# Patient Record
Sex: Male | Born: 1985 | Race: White | Hispanic: No | Marital: Single | Smoking: Light tobacco smoker
Health system: Southern US, Community
[De-identification: ages and names within clinical notes are randomized; demographics above are authoritative.]

## PROBLEM LIST (undated history)

## (undated) HISTORY — PX: SHOULDER ARTHROSCOPY W/ LABRAL REPAIR: SHX2399

---
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
## 2007-07-31 ENCOUNTER — Emergency Department (HOSPITAL_COMMUNITY): Admission: EM | Admit: 2007-07-31 | Discharge: 2007-08-01 | Payer: Self-pay | Admitting: Emergency Medicine

## 2013-11-20 ENCOUNTER — Ambulatory Visit (INDEPENDENT_AMBULATORY_CARE_PROVIDER_SITE_OTHER): Payer: BC Managed Care – PPO | Admitting: Family Medicine

## 2013-11-20 VITALS — BP 112/68 | HR 53 | Temp 97.2°F | Resp 16 | Ht 70.0 in | Wt 149.2 lb

## 2013-11-20 DIAGNOSIS — M79672 Pain in left foot: Secondary | ICD-10-CM

## 2013-11-20 DIAGNOSIS — IMO0002 Reserved for concepts with insufficient information to code with codable children: Secondary | ICD-10-CM

## 2013-11-20 DIAGNOSIS — M79609 Pain in unspecified limb: Secondary | ICD-10-CM

## 2013-11-20 MED ORDER — MELOXICAM 7.5 MG PO TABS: ORAL_TABLET | ORAL | Status: DC

## 2013-11-20 NOTE — Progress Notes (Signed)
[facility] [address], [place] [id number] [phone]  Date:  11/20/2013   Name:  David Hayes   DOB:  06/28/1985   MRN:  [id number]  PCP:  No PCP Per Patient    Chief Complaint: Foot Pain   History of Present Illness:  David Hayes is a 28 y.o. very pleasant male patient who presents with the following:  Here today with a problem with his left foot- he worked yesterday and then noted that the bottom of his feet were painful last night- left more than right.  He works at a [employer] and is on his feet many hours a day.  However he did not notice anything different yesterday and was not aware of injuring his feet He is generally healthy and otherwise he feels well  There are no active problems to display for this patient.   History reviewed. No pertinent past medical history.  History reviewed. No pertinent past surgical history.  History  Substance Use Topics  . Smoking status: Never Smoker   . Smokeless tobacco: Not on file  . Alcohol Use: Not on file    History reviewed. No pertinent family history.  Not on File  Medication list has been reviewed and updated.  No current outpatient prescriptions on file prior to visit.   No current facility-administered medications on file prior to visit.    Review of Systems:  As per HPI- otherwise negative.   Physical Examination: Filed Vitals:   11/20/13 0841  BP: 112/68  Pulse: 53  Temp: 97.2 F (36.2 C)  Resp: 16   Filed Vitals:   11/20/13 0841  Height: 5\' 10"  (1.778 m)  Weight: 149 lb 3.2 oz (67.677 kg)   Body mass index is 21.41 kg/(m^2). Ideal Body Weight: Weight in (lb) to have BMI = 25: 173.9  GEN: WDWN, NAD, Non-toxic, A & O x 3, slim build, looks well HEENT: Atraumatic, Normocephalic. Neck supple. No masses, No LAD. Ears and Nose: No external deformity. CV: RRR, No M/G/R. No JVD. No thrill. No extra heart sounds. PULM: CTA B, no wheezes, crackles, rhonchi. No  retractions. No resp. distress. No accessory muscle use. EXTR: No c/c/e NEURO walking on the sides of his feet PSYCH: Normally interactive. Conversant. Not depressed or anxious appearing.  Calm demeanor.  Feet: he appears to have bums on the plantar surface of both feet- right worse than left.  This is worst over the ball of the foot/ under the big toe.  There is mild edema of this area, normal capillary refill and perfusion of the feet.  His wound appears consistent also with excessive friction/ shear forces on the skin resulting in a friction burn- he will likely blister out under the affected skin  Assessment and Plan: Abrasion or friction burn of foot and toe(s), without mention of infection - Plan: meloxicam (MOBIC) 7.5 MG tablet  Friction burn to feet. He will need to rest and stay off his feet until this is better.  Cold compresses, mobic, note for work.  Follow-up if not better- Sooner if worse.     Signed Abbe AmsterdamJessica Yesica Kemler, MD

## 2013-11-20 NOTE — Patient Instructions (Signed)
You appear to have a type of burn on the bottom of your feet- likely due to friction from walking so much.  Stay off your feet as much as you can over the next couple of days!  You might try cool water soaks or ice packs on the bottom of you feet.  Use the mobic as needed for pain and inflammation.  I hope that you will be better in the next couple of days, but let me know if you are not!  Be sure to wear well- cushioned soft socks when you are working.

## 2014-10-02 ENCOUNTER — Encounter (HOSPITAL_BASED_OUTPATIENT_CLINIC_OR_DEPARTMENT_OTHER): Payer: Self-pay | Admitting: *Deleted

## 2014-10-02 ENCOUNTER — Emergency Department (HOSPITAL_BASED_OUTPATIENT_CLINIC_OR_DEPARTMENT_OTHER)
Admission: EM | Admit: 2014-10-02 | Discharge: 2014-10-02 | Disposition: A | Discharge status: Home / Self Care | Payer: BLUE CROSS/BLUE SHIELD | Attending: Emergency Medicine | Admitting: Emergency Medicine

## 2014-10-02 DIAGNOSIS — L02415 Cutaneous abscess of right lower limb: Secondary | ICD-10-CM | POA: Diagnosis present

## 2014-10-02 DIAGNOSIS — L03115 Cellulitis of right lower limb: Secondary | ICD-10-CM | POA: Diagnosis not present

## 2014-10-02 MED ORDER — SULFAMETHOXAZOLE-TRIMETHOPRIM 800-160 MG PO TABS: 1.0000 | ORAL_TABLET | Freq: Two times a day (BID) | ORAL | Status: AC

## 2014-10-02 MED ORDER — CEPHALEXIN 500 MG PO CAPS: 500.0000 mg | ORAL_CAPSULE | Freq: Three times a day (TID) | ORAL | Status: DC

## 2014-10-02 NOTE — ED Notes (Signed)
Possible abscess to his right knee. Redness and pain.

## 2014-10-02 NOTE — ED Provider Notes (Signed)
CSN: [id number]     Arrival date & time 10/02/14  2139 History  This chart was scribed for David MochaBlair Demari Kropp, MD by Phillis HaggisGabriella Gaje, ED Scribe. This patient was seen in room MH01/MH01 and patient care was started at 9:59 PM.     Chief Complaint  Patient presents with  . Abscess   Patient is a 29 y.o. male presenting with abscess. The history is provided by the patient. No language interpreter was used.  Abscess Location:  Leg Leg abscess location:  R knee Abscess quality: painful and redness   Red streaking: no   Duration:  2 days Progression:  Worsening Pain details:    Quality:  Aching   Severity:  Moderate Relieved by:  Nothing Worsened by:  Nothing tried Associated symptoms: no fever, no nausea and no vomiting     HPI Comments: David Hayes is a 29 y.o. male who presents to the Emergency Department complaining of a possible right knee abscess onset two day ago. He reports shooting pain and redness to the area. He states that it first started as a blister; drained and cleaned the area. States that he has been putting a heating pad on the area to no relief. Denies any new drainage. He denies fever, nausea, or vomiting. Denies allergies to anti-biotics.    History reviewed. No pertinent past medical history. History reviewed. No pertinent past surgical history. No family history on file. History  Substance Use Topics  . Smoking status: Never Smoker   . Smokeless tobacco: Not on file  . Alcohol Use: Yes    Review of Systems  Constitutional: Negative for fever.  Respiratory: Negative for cough and shortness of breath.   Gastrointestinal: Negative for nausea and vomiting.  All other systems reviewed and are negative.     Allergies  Review of patient's allergies indicates no known allergies.  Home Medications   Prior to Admission medications   Medication Sig Start Date End Date Taking? Authorizing Provider  meloxicam (MOBIC) 7.5 MG tablet Take 1 or 2 a day as needed for  pain and swelling in your feet 11/20/13   [name], MD   BP 111/60 mmHg  Pulse 72  Temp(Src) 98.1 F (36.7 C) (Oral)  Resp 20  Ht 6' (1.829 m)  Wt 160 lb (72.576 kg)  BMI 21.70 kg/m2  SpO2 99% Physical Exam  Constitutional: He is oriented to person, place, and time. He appears well-developed and well-nourished. No distress.  HENT:  Head: Normocephalic and atraumatic.  Mouth/Throat: No oropharyngeal exudate.  Eyes: EOM are normal. Pupils are equal, round, and reactive to light.  Neck: Normal range of motion. Neck supple.  Cardiovascular: Normal rate and regular rhythm.  Exam reveals no friction rub.   No murmur heard. Pulmonary/Chest: Effort normal and breath sounds normal. No respiratory distress. He has no wheezes. He has no rales.  Abdominal: He exhibits no distension. There is no tenderness. There is no rebound.  Musculoskeletal: Normal range of motion. He exhibits no edema.       Right knee: Normal.       Legs: Neurological: He is alert and oriented to person, place, and time.  Skin: He is not diaphoretic.  Nursing note and vitals reviewed.   ED Course  Procedures (including critical care time) DIAGNOSTIC STUDIES: Oxygen Saturation is 99% on RA, normal by my interpretation.    COORDINATION OF CARE: 10:00 PM-Discussed treatment plan which includes oral anti-biotics, follow up if symptoms worsen with pt at bedside and  pt agreed to plan.   Labs Review Labs Reviewed - No data to display  Imaging Review No results found.   EKG Interpretation None      MDM   Final diagnoses:  Cellulitis of right leg    29 year old male here with redness at the knee. Located just above the tibial tuberosity, does not extend into the knee joint. No fevers, nausea, vomiting. No pain with fibrillation. He stated started as a blister and he drained it at home. Here he has redness and cellulitis just above the tibial tuberosity. The knee joint is normal without effusion or  redness. No concern for septic arthritis. There is no fluctuance Will treat for simple cellulitis with Bactrim and Keflex. Given instructions on wound care and strict return precautions. I personally performed the services described in this documentation, which was scribed in my presence. The recorded information has been reviewed and is accurate.     David Mocha, MD 10/02/14 [phone]

## 2014-10-02 NOTE — Discharge Instructions (Signed)
Cellulitis °Cellulitis is an infection of the skin and the tissue beneath it. The infected area is usually red and tender. Cellulitis occurs most often in the arms and lower legs.  °CAUSES  °Cellulitis is caused by bacteria that enter the skin through cracks or cuts in the skin. The most common types of bacteria that cause cellulitis are staphylococci and streptococci. °SIGNS AND SYMPTOMS  °· Redness and warmth. °· Swelling. °· Tenderness or pain. °· Fever. °DIAGNOSIS  °Your health care provider can usually determine what is wrong based on a physical exam. Blood tests may also be done. °TREATMENT  °Treatment usually involves taking an antibiotic medicine. °HOME CARE INSTRUCTIONS  °· Take your antibiotic medicine as directed by your health care provider. Finish the antibiotic even if you start to feel better. °· Keep the infected arm or leg elevated to reduce swelling. °· Apply a warm cloth to the affected area up to 4 times per day to relieve pain. °· Take medicines only as directed by your health care provider. °· Keep all follow-up visits as directed by your health care provider. °SEEK MEDICAL CARE IF:  °· You notice red streaks coming from the infected area. °· Your red area gets larger or turns dark in color. °· Your bone or joint underneath the infected area becomes painful after the skin has healed. °· Your infection returns in the same area or another area. °· You notice a swollen bump in the infected area. °· You develop new symptoms. °· You have a fever. °SEEK IMMEDIATE MEDICAL CARE IF:  °· You feel very sleepy. °· You develop vomiting or diarrhea. °· You have a general ill feeling (malaise) with muscle aches and pains. °MAKE SURE YOU:  °· Understand these instructions. °· Will watch your condition. °· Will get help right away if you are not doing well or get worse. °Document Released: 12/29/2004 Document Revised: 08/05/2013 Document Reviewed: 06/06/2011 °ExitCare® Patient Information ©2015 [employer].  This information is not intended to replace advice given to you by your health care provider. Make sure you discuss any questions you have with your health care provider. ° ° °Emergency Department Resource Guide °1) Find a Doctor and Pay Out of Pocket °Although you won't have to find out who is covered by your insurance plan, it is a good idea to ask around and get recommendations. You will then need to call the office and see if the doctor you have chosen will accept you as a new patient and what types of options they offer for patients who are self-pay. Some doctors offer discounts or will set up payment plans for their patients who do not have insurance, but you will need to ask so you aren't surprised when you get to your appointment. ° °2) Contact Your Local Health Department °Not all health departments have doctors that can see patients for sick visits, but many do, so it is worth a call to see if yours does. If you don't know where your local health department is, you can check in your phone book. The CDC also has a tool to help you locate your state's health department, and many state websites also have listings of all of their local health departments. ° °3) Find a Walk-in Clinic °If your illness is not likely to be very severe or complicated, you may want to try a walk in clinic. These are popping up all over the country in pharmacies, drugstores, and shopping centers. They're usually staffed by nurse practitioners   or physician assistants that have been trained to treat common illnesses and complaints. They're usually fairly quick and inexpensive. However, if you have serious medical issues or chronic medical problems, these are probably not your best option. ° °No Primary Care Doctor: °- Call [facility] Connect at  832-8000 - they can help you locate a primary care doctor that  accepts your insurance, provides certain services, etc. °- Physician Referral Service- 1-800-533-3463 ° °Chronic Pain  Problems: °Organization         Address  Phone   Notes  °[facility]  (336) 297-2271 Patients need to be referred by their primary care doctor.  ° °Medication Assistance: °Organization         Address  Phone   Notes  °[place] [facility] [address] °[place], [place] [address] (336) 641-8030 --Must be a resident of [place] °-- Must have NO insurance coverage whatsoever (no Medicaid/ Medicare, etc.) °-- The pt. MUST have a primary care doctor that directs their care regularly and follows them in the community °  °[facility]  (866) 331-1348   °[employer]  (888) 892-1162   ° °Agencies that provide inexpensive medical care: °Organization         Address  Phone   Notes  °[facility]  (336) 832-8035   °[facility]    (336) 832-7272   °[facility] [address] °[place], [place] [address] (336) 832-4777   °[facility] [address], °[place] (336) 271-4999   °[facility]    (336) 373-0678   °[facility]    (336) 272-1050   °[facility] ° [address], [place] Phone:  (336) 832-4444, Fax:  (336) 832-4440 Hours of Operation:  9 am - 6 pm, M-F.  Also accepts Medicaid/Medicare and self-pay.  °[facility] ° [address], [place] Phone: (336) 832-3150, Fax: (336) 832-3151. Hours of Operation:  8:30 am - 5:30 pm, M-F.  Also accepts Medicaid and self-pay.  °[facility] [place] [address], [place] Phone: (336) 878-6027   °[facility] [address], [place], [place] (336)723-1848, Ext. 123 Mondays & Thursdays: 7-9 AM.  First 15 patients are seen on a first come, first serve basis. °  ° °Medicaid-accepting [place] Providers: ° °Organization         Address  Phone   Notes  °[facility] [address], [place] (336) 641-2100 Also  accepts self-pay patients.  °[facility] [address], [place] ° (336) 856-9996   °[facility] [address] [place] [address], [place] (336) 288-8857   °[facility] [address] [place] [address], [place] (336) 299-7000   °Veita Bland [address], [place]  ° (336) 373-1557 Only accepts [place] Access Medicaid patients after they have their name applied to their card.  ° °Self-Pay (no insurance) in [place]: ° °Organization         Address  Phone   Notes  °Sickle Cell Patients, [facility] [address], [place] (336) 832-1970   °[facility] [address], [place] (336) 832-4400   °[facility] ° [address], [place] (336) 992-4800   °[facility]/Dr. Osei-Bonsu ° [address]   [place] [address], [place] or [address], [place] (336) 841-8500 Phone number for both [place] and [place] locations is the same.  °[facility] [address], [place] (336) 299-0000   °[facility] [address] [place] [address], [place] or [address] (336) 852-7530 °(336) 878-2260   °[facility] [address], [place] (336) 350-1642, phone; (336) 294-5005, fax Sees patients 1st and 3rd Saturday of every month.  Must not qualify for public or private insurance (i.e. Medicaid, Medicare, [place] Health Choice, Veterans' Benefits) • Household income should be no more than 200% of the poverty level •The clinic cannot treat you if you are pregnant or think you are pregnant • Sexually transmitted diseases are not treated at the clinic.  ° ° °Dental Care: °Organization         Address  Phone  Notes  °[facility] [address], [place] (336) 641-6152 Accepts children up to age 21 who are enrolled in Medicaid or [place] Health Choice; pregnant  women with a Medicaid card; and children who have applied for Medicaid or [place] Health Choice, but were declined, whose parents can pay a reduced fee at time of service.  °[facility] [place]  [address], [place] (336) 641-7733 Accepts children up to age 21 who are enrolled in Medicaid or [place] Health Choice; pregnant women with a Medicaid card; and children who have applied for Medicaid or [place] Health Choice, but were declined, whose parents can pay a reduced fee at time of service.  °[facility] ° [address], [place] (336) 641-4533 Patients are seen by appointment only. Walk-ins are not accepted. [facility] will see patients 18 years of age and older. °Monday - Tuesday (8am-5pm) °Most Wednesdays (8:30-5pm) °$30 per visit, cash only  °[facility] ° [address], [place] (336) 641-4533 Patients are seen by appointment only. Walk-ins are not accepted. [facility] will see patients 18 years of age and older. °One Wednesday Evening (Monthly: Volunteer Based).  $30 per visit, cash only  °[facility]  (919) 537-3737 for adults; Children under age 4, call Graduate Pediatric Dentistry at (919) [phone]. Children aged 4-14, please call (919) [phone] to request a pediatric application. ° Dental services are provided in all areas of dental care including fillings, crowns and bridges, complete and partial dentures, implants, gum treatment, root canals, and extractions. Preventive care is also provided. Treatment is provided to both adults and children. °Patients are selected via a lottery and there is often a waiting list. °  °[facility] [address], ° ° (336) 763-8833 [email or web address] °  °[facility] [address], [place], [place] (336)723-1848, Ext. 123 Second and Fourth Thursday of each month, opens at 6:30 AM; Clinic ends at 9 AM.  Patients are  seen on a first-come first-served basis, and a limited number are seen during each clinic.  ° °[facility] ° [address], [place],  (336) 723-7904   Eligibility Requirements °You must have lived in [place], [place], or [place] counties for at least the last three months. °  You cannot be eligible for state or federal sponsored healthcare insurance, including Veterans Administration, Medicaid, or Medicare. °  You generally cannot be eligible for healthcare insurance through your employer.  °    How to apply: °Eligibility screenings are held every Tuesday and Wednesday afternoon from 1:00 pm until 4:00 pm. You do not need an appointment for the interview!  °[facility] [address], [place], [place] 336-631-2330   °[facility]  336-342-8273   °[facility]  336-703-3100   °[facility]  336-570-6415   ° °Behavioral Health Resources in the Community: °Intensive Outpatient Programs °Organization         Address  Phone  Notes  °[facility] [address], [place], [place] 336-878-6098   °[facility] [address], [place], [place] 336-832-9800   °[facility] [address], [place], [place] ° 336-882-2125   °[facility] [address],  °[place], [place] 1-800-853-5163 or 336-641-4981   °Substance Abuse Resources °Organization         Address  Phone  Notes  °[facility]  336-882-2125   °[facility]  336-784-9470   °[facility]  336-285-9073   °[facility]  336-845-3988   °Residential & Outpatient Substance Abuse Program  1-800-659-3381   °Psychological Services °Organization         Address  Phone  Notes  °[facility]  336- 832-9600   °[facility]  336- 378-7881   °[facility] [address], [place] 1-800-853-5163 or 336-641-4981   ° °Mobile Crisis  Teams °Organization         Address  Phone  Notes  °[facility], Mobile Crisis Care Unit  1-877-626-1772   °Assertive °[facility] ° [address] [place], [place] 336-834-9664   °Sharon DeEsch [address] °[place] 336-554-5454   ° °Self-Help/Support Groups °Organization         Address  Phone             Notes  °[facility] - variety of support groups  336- 373-1402 Call for more information  °Narcotics Anonymous (NA), [facility] [address], °[place]  2 meetings at this location  ° °Residential Treatment Programs °Organization         Address  Phone  Notes  °[facility] [address],    °[place]  1-866-801-8205   °[facility] ° [address], [place], [place] 704-293-8524   °[facility] [address], [place] 336-845-3988 Admissions: 8am-3pm M-F  °[facility] [address]    °[place], [place] 336-841-1104   °[facility] [address], [place], [place] 336-379-7146   °[facility] [address]  °[place], [place] 336-285-9073   °[facility] - Intensive Outpatient [address], [place], [place] 336-852-3033   °[facility] [address]  °[place], [place] 1-877-615-2722 or 336-784-9470   °[facility] [address] [place], [place] 336-227-7417 Accepts Medicaid  °[facility] [address]  °[place] 1-800-659-3381 Substance Abuse/Addiction Treatment  ° °[place] Behavioral Health Resources °Organization         Address  Phone  Notes  °[facility]  (888) 581-9988   °Julie Brannon, PhD [address] [place], [place]   (336) 349-5553 or (336) 951-0000   °[facility]   [address] °[place], [place] (336) 349-4454   °[facility] [address],   [place], [place] (336) 342-8316  Insurance/Medicaid/sponsorship through [facility]  °[facility] [address]                                    [place], [place] (336) 342-8316 Therapy/tele-psych/case  °[facility] [address]  ° [place], [place] (336) 349-2233    °Dr. Arfeen  (336) 349-4544   °[facility] [place]  [employer] [place] [facility] 1) [address], [place] °2) [address], [place] °3)  [address], [place] (336) 349-3220 °(336) 342-7768 ° °(336) 342-8140   °[place] Child Abuse Hotline (336) 342-1394 or (336) 342-3537 (After Hours)    ° ° ° °

## 2017-03-28 ENCOUNTER — Other Ambulatory Visit: Payer: Self-pay

## 2017-03-28 ENCOUNTER — Encounter (HOSPITAL_COMMUNITY): Payer: Self-pay | Admitting: Emergency Medicine

## 2017-03-28 ENCOUNTER — Emergency Department (HOSPITAL_COMMUNITY)
Admission: EM | Admit: 2017-03-28 | Discharge: 2017-03-28 | Disposition: A | Discharge status: Home / Self Care | Payer: Commercial Managed Care - PPO | Attending: Emergency Medicine | Admitting: Emergency Medicine

## 2017-03-28 DIAGNOSIS — Y9389 Activity, other specified: Secondary | ICD-10-CM | POA: Diagnosis not present

## 2017-03-28 DIAGNOSIS — Y999 Unspecified external cause status: Secondary | ICD-10-CM | POA: Insufficient documentation

## 2017-03-28 DIAGNOSIS — S61212A Laceration without foreign body of right middle finger without damage to nail, initial encounter: Secondary | ICD-10-CM | POA: Diagnosis not present

## 2017-03-28 DIAGNOSIS — W260XXA Contact with knife, initial encounter: Secondary | ICD-10-CM | POA: Diagnosis not present

## 2017-03-28 DIAGNOSIS — Y929 Unspecified place or not applicable: Secondary | ICD-10-CM | POA: Diagnosis not present

## 2017-03-28 DIAGNOSIS — Z23 Encounter for immunization: Secondary | ICD-10-CM | POA: Diagnosis not present

## 2017-03-28 MED ORDER — LIDOCAINE HCL (PF) 1 % IJ SOLN
5.0000 mL | Freq: Once | INTRAMUSCULAR | Status: AC
  Administered 2017-03-28: 5 mL
  Filled 2017-03-28: qty 5

## 2017-03-28 MED ORDER — TETANUS-DIPHTH-ACELL PERTUSSIS 5-2.5-18.5 LF-MCG/0.5 IM SUSP
0.5000 mL | Freq: Once | INTRAMUSCULAR | Status: AC
  Administered 2017-03-28: 0.5 mL via INTRAMUSCULAR
  Filled 2017-03-28: qty 0.5

## 2017-03-28 NOTE — ED Provider Notes (Signed)
[facility] Provider Note   CSN: [id number] Arrival date & time: 03/28/17  1714     History   Chief Complaint Chief Complaint  Patient presents with  . Laceration    HPI Providence CrosbyBrian Mato is a 31 y.o. male acute onset of laceration to right third finger.  Patient states he was trying to cut a hole in his belt with his pocket knife when his knife folded and cut his finger.  Denies decreased range of motion, or any other injuries.  Thinks his tetanus may be slightly out of date at 5 years.  No history of immunocompromise.  Not on anticoagulation. The history is provided by the patient.    History reviewed. No pertinent past medical history.  There are no active problems to display for this patient.   Past Surgical History:  Procedure Laterality Date  . SHOULDER ARTHROSCOPY W/ LABRAL REPAIR         Home Medications    Prior to Admission medications   Medication Sig Start Date End Date Taking? Authorizing Provider  cephALEXin (KEFLEX) 500 MG capsule Take 1 capsule (500 mg total) by mouth 3 (three) times daily. Patient not taking: Reported on 03/28/2017 10/02/14   Elwin MochaWalden, Blair, MD  meloxicam [facility](MOBIC) 7.5 MG tablet Take 1 or 2 a day as needed for pain and swelling in your feet Patient not taking: Reported on 03/28/2017 11/20/13   Copland, Gwenlyn FoundJessica C, MD    Family History History reviewed. No pertinent family history.  Social History Social History   Tobacco Use  . Smoking status: Never Smoker  . Smokeless tobacco: Never Used  Substance Use Topics  . Alcohol use: Yes  . Drug use: No     Allergies   Patient has no known allergies.   Review of Systems Review of Systems  Skin: Positive for wound.  All other systems reviewed and are negative.    Physical Exam Updated Vital Signs BP 115/77 (BP Location: Left Arm)   Pulse 65   Temp 97.7 F (36.5 C) (Oral)   Resp 15   Ht 5' 11.5" (1.816 m)   Wt 72.6 kg (160 lb)   SpO2  100%   BMI 22.00 kg/m   Physical Exam  Constitutional: He appears well-developed and well-nourished.  HENT:  Head: Normocephalic and atraumatic.  Eyes: Conjunctivae are normal.  Cardiovascular: Intact distal pulses.  Pulmonary/Chest: Effort normal.  Musculoskeletal:  1.5 cm laceration to palmar aspect extending to Adderall aspect of distal right third digit.  Grossly contaminated.  Full active flexion and extension of digit.  Intact distal pulses.  Normal sensation distal finger.  Psychiatric: He has a normal mood and affect. His behavior is normal.  Nursing note and vitals reviewed.    ED Treatments / Results  Labs (all labs ordered are listed, but only abnormal results are displayed) Labs Reviewed - No data to display  EKG  EKG Interpretation None       Radiology No results found.  Procedures .Marland Kitchen.Laceration Repair Date/Time: 03/28/2017 10:26 PM Performed by: Eveline Sauve, [place] N, PA-C Authorized by: Michelyn Scullin, [place] N, PA-C   Consent:    Consent obtained:  Verbal   Consent given by:  Patient   Risks discussed:  Infection, poor cosmetic result and pain   Alternatives discussed:  No treatment and observation Anesthesia (see MAR for exact dosages):    Anesthesia method:  Nerve block   Block needle gauge:  25 G   Block anesthetic:  Lidocaine 1%  w/o epi   Block injection procedure:  Anatomic landmarks palpated   Block outcome:  Anesthesia achieved Laceration details:    Location:  Finger   Finger location:  R long finger   Length (cm):  1.5 Repair type:    Repair type:  Simple Pre-procedure details:    Preparation:  Patient was prepped and draped in usual sterile fashion Exploration:    Hemostasis achieved with:  Direct pressure   Wound exploration: wound explored through full range of motion and entire depth of wound probed and visualized     Wound extent: no foreign bodies/material noted and no tendon damage noted   Treatment:    Area cleansed with:  Saline    Irrigation solution:  Sterile saline   Irrigation method:  Pressure wash Skin repair:    Repair method:  Sutures   Suture size:  5-0   Suture material:  Prolene   Number of sutures:  5 Approximation:    Approximation:  Close   Vermilion border: well-aligned   Post-procedure details:    Dressing:  Non-adherent dressing and splint for protection   Patient tolerance of procedure:  Tolerated well, no immediate complications   (including critical care time)  Medications Ordered in ED Medications  lidocaine (PF) (XYLOCAINE) 1 % injection 5 mL (5 mLs Infiltration Given 03/28/17 2129)  Tdap (BOOSTRIX) injection 0.5 mL (0.5 mLs Intramuscular Given 03/28/17 2128)     Initial Impression / Assessment and Plan / ED Course  I have reviewed the triage vital signs and the nursing notes.  Pertinent labs & imaging results that were available during my care of the patient were reviewed by me and considered in my medical decision making (see chart for details).     Pt with laceration to right 3rd digit. Normal active ROM of digit. Pressure irrigation performed. Wound explored and base of wound visualized in a bloodless field without evidence of foreign body or tendon damage.  Laceration occurred < 8 hours prior to repair which was well tolerated.  Tdap updated.  Pt has no comorbidities to effect normal wound healing. Pt discharged  without antibiotics.  Discussed suture home care with patient and answered questions. Pt to follow-up for wound check and suture removal in 5-7 days; they are to return to the ED sooner for signs of infection. Pt is hemodynamically stable with no complaints prior to dc.   Discussed results, findings, treatment and follow up. Patient advised of return precautions. Patient verbalized understanding and agreed with plan.  Final Clinical Impressions(s) / ED Diagnoses   Final diagnoses:  Laceration of right middle finger without foreign body without damage to nail, initial  encounter    ED Discharge Orders    None       Milen Lengacher, [place] N, PA-C 03/28/17 2230    Shaune PollackIsaacs, Cameron, MD 03/29/17 [phone]

## 2017-03-28 NOTE — ED Triage Notes (Signed)
Pt presents from home with an approximately 1 inch laceration to his right middle finger. Pt states he was using his pocket knife to cut a hole in his belt when the knife closed on his finger.

## 2017-03-28 NOTE — ED Notes (Signed)
See provider assessment 

## 2017-03-28 NOTE — Discharge Instructions (Signed)
Please read instructions below.  Keep your wound clean and covered.  24 hours, you can begin gently rinsing your finger with soap and water.  Gently pat it dry, and recover.  Wear the splint while at physical therapy. You can take Advil or Tylenol every 6 hours as needed for pain. Follow up with your primary care or urgent care for wound recheck in 5-7 days.  Return to the ER for fever, pus draining from wound, redness, or new or worsening symptoms.

## 2017-08-11 ENCOUNTER — Other Ambulatory Visit: Payer: Self-pay

## 2017-08-11 ENCOUNTER — Encounter (HOSPITAL_COMMUNITY): Payer: Self-pay | Admitting: *Deleted

## 2017-08-11 ENCOUNTER — Emergency Department (HOSPITAL_COMMUNITY)
Admission: EM | Admit: 2017-08-11 | Discharge: 2017-08-11 | Disposition: A | Discharge status: Home / Self Care | Payer: Commercial Managed Care - PPO | Attending: Emergency Medicine | Admitting: Emergency Medicine

## 2017-08-11 DIAGNOSIS — J020 Streptococcal pharyngitis: Secondary | ICD-10-CM

## 2017-08-11 DIAGNOSIS — J029 Acute pharyngitis, unspecified: Secondary | ICD-10-CM | POA: Diagnosis present

## 2017-08-11 LAB — GROUP A STREP BY PCR: GROUP A STREP BY PCR: DETECTED — AB

## 2017-08-11 NOTE — ED Provider Notes (Signed)
[facility] EMERGENCY DEPARTMENT Provider Note   CSN: [id number] Arrival date & time: 08/11/17  1037     History   Chief Complaint No chief complaint on file.   HPI David Hayes is a 32 y.o. male.  HPI  Patient is a 32 year old male with no sniffing past Casimiro Needle history presenting for sore throat.  Patient reports that it began 2 days ago, and he was having difficulty swallowing in the morning due to the pain.  Patient denies any obstructive swallowing, difficulty swallowing liquids, or difficulty breathing.  Patient denies any neck swelling, induration, or semitubular tenderness.  Patient reports that he spoke with a tele-physician through a insurance program through his work, and was told that he may have strep throat, and prescribed amoxicillin and ibuprofen.  Patient reports that he took 2 doses, but his sore throat was no better this morning.  Patient denies any fever or chills throughout this course.  Patient reports he is slightly congested, and he was coughing at the onset of illness, but is not a persistent cough.  History reviewed. No pertinent past medical history.  There are no active problems to display for this patient.   Past Surgical History:  Procedure Laterality Date  . SHOULDER ARTHROSCOPY W/ LABRAL REPAIR          Home Medications    Prior to Admission medications   Medication Sig Start Date End Date Taking? Authorizing Provider  cephALEXin (KEFLEX) 500 MG capsule Take 1 capsule (500 mg total) by mouth 3 (three) times daily. Patient not taking: Reported on 03/28/2017 10/02/14   Elwin Mocha, MD  meloxicam [facility]) 7.5 MG tablet Take 1 or 2 a day as needed for pain and swelling in your feet Patient not taking: Reported on 03/28/2017 11/20/13   Copland, Gwenlyn Found, MD    Family History No family history on file.  Social History Social History   Tobacco Use  . Smoking status: Never Smoker  . Smokeless tobacco: Never Used    Substance Use Topics  . Alcohol use: Yes  . Drug use: No     Allergies   Patient has no known allergies.   Review of Systems Review of Systems  Constitutional: Negative for chills and fever.  HENT: Positive for congestion, rhinorrhea and sore throat. Negative for trouble swallowing and voice change.   Respiratory: Negative for stridor.   Skin: Negative for rash.     Physical Exam Updated Vital Signs BP 119/73 (BP Location: Right Arm)   Pulse (!) 51   Temp 97.7 F (36.5 C) (Oral)   Resp 18   Ht 6' (1.829 m)   Wt 72.6 kg (160 lb)   SpO2 100%   BMI 21.70 kg/m   Physical Exam  Constitutional: He appears well-developed and well-nourished. No distress.  Sitting comfortably in bed.  HENT:  Head: Normocephalic and atraumatic.  Normal phonation. No muffled voice sounds. Patient swallows secretions without difficulty. Dentition normal. No lesions of tongue or buccal mucosa. Uvula midline. No asymmetric swelling of the posterior pharynx.erythema present of the posterior pharynx without exudate.  No lingual swelling. No induration inferior to tongue. No submandibular tenderness, swelling, or induration.  Tissues of the neck supple. + bilateral cervical lymphadenopathy.  Eyes: Conjunctivae are normal. Right eye exhibits no discharge. Left eye exhibits no discharge.  EOMs normal to gross examination.  Neck: Normal range of motion.  Cardiovascular: Normal rate, regular rhythm and normal heart sounds.  No murmur heard. Pulmonary/Chest: Effort normal and  breath sounds normal. He has no wheezes. He has no rales.  Normal respiratory effort. Patient converses comfortably. No audible wheeze or stridor.  Abdominal: He exhibits no distension.  Musculoskeletal: Normal range of motion.  Neurological: He is alert.  Cranial nerves intact to gross observation. Patient moves extremities without difficulty.  Skin: Skin is warm and dry. He is not diaphoretic.  Psychiatric: He has a normal mood  and affect. His behavior is normal. Judgment and thought content normal.  Nursing note and vitals reviewed.    ED Treatments / Results  Labs (all labs ordered are listed, but only abnormal results are displayed) Labs Reviewed  GROUP A STREP BY PCR - Abnormal; Notable for the following components:      Result Value   Group A Strep by PCR DETECTED (*)    All other components within normal limits    EKG None  Radiology No results found.  Procedures Procedures (including critical care time)  Medications Ordered in ED Medications - No data to display   Initial Impression / Assessment and Plan / ED Course  I have reviewed the triage vital signs and the nursing notes.  Pertinent labs & imaging results that were available during my care of the patient were reviewed by me and considered in my medical decision making (see chart for details).     Brandley Aldrete is a 32 y.o. male who presents to ED for sore throat sore throat. Patient nontoxic appearing and in no acute distress. Rapid strep positive, and patient is already on antibiotics.  We will have patient continue.  Presentation not concerning for PTA or infection spread to soft tissue. No trismus or uvula deviation. Patient with normal phonation. Exam demonstrates soft neck tissue, no swelling or induration inferior to the tongue or in the submandibular space  Patient able to drink water in ED without difficulty with intact air way. Specific return precautions discussed for change in voice, inability to tolerate secretions, difficulty breathing or swallowing, or increased nausea or vomiting. Discussed importance of hydration. Recommended PCP follow up. All questions answered.  Final Clinical Impressions(s) / ED Diagnoses   Final diagnoses:  Strep pharyngitis    ED Discharge Orders    None       Delia Chimes 08/11/17 1428    Raeford Razor, MD 08/14/17 [phone]

## 2017-08-11 NOTE — ED Notes (Signed)
Pt tolerating ice water at this time.

## 2017-08-11 NOTE — ED Notes (Signed)
Patient verbalizes understanding of discharge instructions. Opportunity for questioning and answers were provided. Armband removed by staff, pt discharged from ED ambulatory.   

## 2017-08-11 NOTE — ED Triage Notes (Signed)
Wed c/o sorethroat, spoke with [place] doc was given amoxicillin started taking on Thurs, called [facility] doc back on Thurs. Continue plan, states hes still not feeling better.

## 2017-08-11 NOTE — Discharge Instructions (Signed)
Please read and follow all provided instructions.  Your diagnoses today include:  1. Strep pharyngitis     Tests performed today include: Strep test: was POSITIVE for strep throat Vital signs. See below for your results today.   Medications prescribed:  Please continue the antibiotics that you were prescribed per the telemedicine physician.   Take any medications prescribed only as directed.   Home care instructions:  Please read the educational materials provided and follow any instructions contained in this packet.  Follow-up instructions: Please follow-up with your primary care provider as needed for further evaluation of your symptoms.  Return instructions:  Please return to the Emergency Department if you experience worsening symptoms.  Return if you have worsening problems swallowing, your neck becomes swollen, you cannot swallow your saliva or your voice becomes muffled.  Return with high persistent fever, persistent vomiting, or if you have trouble breathing.  Please return if you have any other emergent concerns.  Additional Information:  Your vital signs today were: BP 119/73 (BP Location: Right Arm)    Pulse (!) 51    Temp 97.7 F (36.5 C) (Oral)    Resp 18    Ht 6' (1.829 m)    Wt 72.6 kg (160 lb)    SpO2 100%    BMI 21.70 kg/m  If your blood pressure (BP) was elevated above 135/85 this visit, please have this repeated by your doctor within one month.

## 2017-08-12 ENCOUNTER — Telehealth: Payer: Self-pay

## 2017-08-12 NOTE — Telephone Encounter (Signed)
Post ED Visit - Positive Culture Follow-up  Culture report reviewed by antimicrobial stewardship pharmacist:   Enzo Bi, Pharm.D.  Celedonio Miyamoto, Pharm.D., BCPS AQ-ID  Garvin Fila, Pharm.D., BCPS  Georgina Pillion, Pharm.D., BCPS  [place], [place].D., BCPS, AAHIVP  Estella Husk, Pharm.D., BCPS, AAHIVP  Lysle Pearl, PharmD, BCPS  Sherlynn Carbon, PharmD  Pollyann Samples, PharmD, BCPS  Positive strep culture Treated with Amoxicillin, organism sensitive to the same and no further patient follow-up is required at this time.  Jerry Caras 08/12/2017, 10:22 AM

## 2017-10-18 ENCOUNTER — Encounter (HOSPITAL_COMMUNITY): Payer: Self-pay | Admitting: Emergency Medicine

## 2017-10-18 ENCOUNTER — Emergency Department (HOSPITAL_COMMUNITY): Payer: Commercial Managed Care - PPO

## 2017-10-18 ENCOUNTER — Emergency Department (HOSPITAL_COMMUNITY)
Admission: EM | Admit: 2017-10-18 | Discharge: 2017-10-18 | Disposition: A | Discharge status: Home / Self Care | Payer: Commercial Managed Care - PPO | Attending: Emergency Medicine | Admitting: Emergency Medicine

## 2017-10-18 ENCOUNTER — Other Ambulatory Visit: Payer: Self-pay

## 2017-10-18 DIAGNOSIS — M549 Dorsalgia, unspecified: Secondary | ICD-10-CM | POA: Insufficient documentation

## 2017-10-18 DIAGNOSIS — R0789 Other chest pain: Secondary | ICD-10-CM | POA: Insufficient documentation

## 2017-10-18 DIAGNOSIS — R0781 Pleurodynia: Secondary | ICD-10-CM

## 2017-10-18 MED ORDER — IBUPROFEN 200 MG PO TABS
600.0000 mg | ORAL_TABLET | Freq: Once | ORAL | Status: AC
  Administered 2017-10-18: 600 mg via ORAL
  Filled 2017-10-18: qty 3

## 2017-10-18 MED ORDER — ACETAMINOPHEN 500 MG PO TABS: 500.0000 mg | ORAL_TABLET | Freq: Four times a day (QID) | ORAL | 0 refills | Status: DC | PRN

## 2017-10-18 MED ORDER — IBUPROFEN 600 MG PO TABS: 600.0000 mg | ORAL_TABLET | Freq: Four times a day (QID) | ORAL | 0 refills | Status: DC | PRN

## 2017-10-18 NOTE — Discharge Instructions (Signed)
Take ibuprofen every 6 hours as prescribed.  You can also alternate with Tylenol.  Use ice and heat alternating 20 minutes on, 20 minutes off.  Please return to the emergency department if you develop any new or worsening symptoms.  You can establish care with a primary care provider by calling the number circled on your discharge paperwork.
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
## 2017-11-20 ENCOUNTER — Encounter (HOSPITAL_COMMUNITY): Payer: Self-pay | Admitting: Emergency Medicine

## 2017-11-20 ENCOUNTER — Emergency Department (HOSPITAL_COMMUNITY): Payer: Commercial Managed Care - PPO

## 2017-11-20 ENCOUNTER — Other Ambulatory Visit: Payer: Self-pay

## 2017-11-20 ENCOUNTER — Emergency Department (HOSPITAL_COMMUNITY)
Admission: EM | Admit: 2017-11-20 | Discharge: 2017-11-20 | Disposition: A | Discharge status: Home / Self Care | Payer: Commercial Managed Care - PPO | Attending: Emergency Medicine | Admitting: Emergency Medicine

## 2017-11-20 DIAGNOSIS — S50811A Abrasion of right forearm, initial encounter: Secondary | ICD-10-CM | POA: Insufficient documentation

## 2017-11-20 DIAGNOSIS — Y9289 Other specified places as the place of occurrence of the external cause: Secondary | ICD-10-CM | POA: Insufficient documentation

## 2017-11-20 DIAGNOSIS — Z720 Tobacco use: Secondary | ICD-10-CM | POA: Insufficient documentation

## 2017-11-20 DIAGNOSIS — Y9389 Activity, other specified: Secondary | ICD-10-CM | POA: Diagnosis not present

## 2017-11-20 DIAGNOSIS — Y999 Unspecified external cause status: Secondary | ICD-10-CM | POA: Insufficient documentation

## 2017-11-20 DIAGNOSIS — S79911A Unspecified injury of right hip, initial encounter: Secondary | ICD-10-CM | POA: Diagnosis not present

## 2017-11-20 DIAGNOSIS — S299XXA Unspecified injury of thorax, initial encounter: Secondary | ICD-10-CM | POA: Diagnosis present

## 2017-11-20 MED ORDER — METHOCARBAMOL 500 MG PO TABS: 500.0000 mg | ORAL_TABLET | Freq: Two times a day (BID) | ORAL | 0 refills | Status: AC
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
## 2017-11-20 NOTE — ED Notes (Signed)
Patient transported to X-ray 

## 2017-11-20 NOTE — Discharge Instructions (Addendum)
Prescribe muscle relaxers for your pain, please do not take these while drinking alcohol or driving as it can make you drowsy.  Apply heat/ice to the area.  If your symptoms worsen or you experience any chest pain, shortness of breath please return to the ED for reevaluation.

## 2017-11-23 ENCOUNTER — Emergency Department (HOSPITAL_COMMUNITY)
Admission: EM | Admit: 2017-11-23 | Discharge: 2017-11-23 | Disposition: A | Discharge status: Home / Self Care | Payer: Commercial Managed Care - PPO | Attending: Emergency Medicine | Admitting: Emergency Medicine

## 2017-11-23 ENCOUNTER — Encounter (HOSPITAL_COMMUNITY): Payer: Self-pay

## 2017-11-23 ENCOUNTER — Other Ambulatory Visit: Payer: Self-pay

## 2017-11-23 DIAGNOSIS — F17210 Nicotine dependence, cigarettes, uncomplicated: Secondary | ICD-10-CM | POA: Insufficient documentation

## 2017-11-23 DIAGNOSIS — K529 Noninfective gastroenteritis and colitis, unspecified: Secondary | ICD-10-CM | POA: Diagnosis not present

## 2017-11-23 DIAGNOSIS — R112 Nausea with vomiting, unspecified: Secondary | ICD-10-CM | POA: Diagnosis present

## 2017-11-23 DIAGNOSIS — Z79899 Other long term (current) drug therapy: Secondary | ICD-10-CM | POA: Diagnosis not present

## 2017-11-23 DIAGNOSIS — A084 Viral intestinal infection, unspecified: Secondary | ICD-10-CM

## 2017-11-23 LAB — COMPREHENSIVE METABOLIC PANEL
ALK PHOS: 72 U/L (ref 38–126)
ALT: 19 U/L (ref 0–44)
ANION GAP: 5 (ref 5–15)
AST: 26 U/L (ref 15–41)
Albumin: 3.8 g/dL (ref 3.5–5.0)
BILIRUBIN TOTAL: 0.6 mg/dL (ref 0.3–1.2)
BUN: 13 mg/dL (ref 6–20)
CALCIUM: 8.7 mg/dL — AB (ref 8.9–10.3)
CO2: 27 mmol/L (ref 22–32)
Chloride: 107 mmol/L (ref 98–111)
Creatinine, Ser: 1.03 mg/dL (ref 0.61–1.24)
GFR calc non Af Amer: >60 mL/min (ref >60)
Glucose, Bld: 118 mg/dL — ABNORMAL HIGH (ref 70–99)
Potassium: 3.6 mmol/L (ref 3.5–5.1)
Sodium: 139 mmol/L (ref 135–145)
TOTAL PROTEIN: 6.8 g/dL (ref 6.5–8.1)

## 2017-11-23 LAB — URINALYSIS, ROUTINE W REFLEX MICROSCOPIC
BILIRUBIN URINE: NEGATIVE
Glucose, UA: NEGATIVE mg/dL
Hgb urine dipstick: NEGATIVE
Ketones, ur: NEGATIVE mg/dL
Leukocytes, UA: NEGATIVE
NITRITE: NEGATIVE
PROTEIN: NEGATIVE mg/dL
SPECIFIC GRAVITY, URINE: 1.025 (ref 1.005–1.030)
pH: 6 (ref 5.0–8.0)

## 2017-11-23 LAB — CBC
HCT: 44.2 % (ref 39.0–52.0)
HEMOGLOBIN: 14.6 g/dL (ref 13.0–17.0)
MCH: 29.1 pg (ref 26.0–34.0)
MCHC: 33 g/dL (ref 30.0–36.0)
MCV: 88.2 fL (ref 78.0–100.0)
PLATELETS: 237 10*3/uL (ref 150–400)
RBC: 5.01 MIL/uL (ref 4.22–5.81)
RDW: 13.2 % (ref 11.5–15.5)
WBC: 6.3 10*3/uL (ref 4.0–10.5)

## 2017-11-23 LAB — LIPASE, BLOOD: Lipase: 37 U/L (ref 11–51)

## 2017-11-23 MED ORDER — ONDANSETRON 4 MG PO TBDP
4.0000 mg | ORAL_TABLET | Freq: Once | ORAL | Status: AC
Start: 2017-11-23 — End: 2017-11-23
  Administered 2017-11-23: 4 mg via ORAL
  Filled 2017-11-23: qty 1

## 2017-11-23 MED ORDER — ONDANSETRON HCL 4 MG PO TABS: 4.0000 mg | ORAL_TABLET | Freq: Four times a day (QID) | ORAL | 0 refills | Status: AC

## 2017-11-23 NOTE — ED Notes (Signed)
Pt reports waking up yesterday drenched in sweat, felt fatigued.  Woke up this am with n/v/d.  Vomited x 3 and also had 3 episodes of diarrhea.  He denies abd pain or any urinary sxs.  He is A&O x 4 and in NAD

## 2017-11-23 NOTE — ED Triage Notes (Signed)
Pt endorses n/v/d that began this morning after not feeling well yesterday. VSS. Feels better since having the n/v/d.

## 2017-11-23 NOTE — ED Provider Notes (Signed)
Patient placed in Quick Look pathway, seen and evaluated   Chief Complaint:  N/v/d  HPI:   Patient states he woke up yesterday feeling poorly.  Since then, he has had approximately 5 episodes of vomiting and multiple episodes of diarrhea.  He denies hematemesis or blood in the stool.  He denies current fevers or chills.  He denies current nausea.  He denies abdominal pain.  He denies sick contacts.  He is not on any antibiotics, takes no medications daily.  He has not taken anything for his symptoms.  No history of abdominal problems or abdominal surgeries.  ROS: n/v/d  Physical Exam:   Gen: No distress  Neuro: Awake and Alert  Skin: Warm  Abdomen: No tenderness palpation of the abdomen.  Soft without rigidity, guarding, distention.  Negative rebound.  No signs of peritonitis.  Negative Murphy's.   Initiation of care has begun. The patient has been counseled on the process, plan, and necessity for staying for the completion/evaluation, and the remainder of the medical screening examination    Alveria ApleyCaccavale, Tennyson Kallen, PA-C 11/23/17 1435    Long, Arlyss RepressJoshua G, MD 11/23/17 2022

## 2017-11-23 NOTE — Discharge Instructions (Addendum)
Please read attached information. If you experience any new or worsening signs or symptoms please return to the emergency room for evaluation. Please follow-up with your primary care provider or specialist as discussed. Please use medication prescribed only as directed and discontinue taking if you have any concerning signs or symptoms.   °
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
## 2018-01-01 ENCOUNTER — Encounter (HOSPITAL_COMMUNITY): Payer: Self-pay | Admitting: Emergency Medicine

## 2018-01-01 ENCOUNTER — Emergency Department (HOSPITAL_COMMUNITY)
Admission: EM | Admit: 2018-01-01 | Discharge: 2018-01-01 | Disposition: A | Discharge status: Home / Self Care | Payer: Commercial Managed Care - PPO | Attending: Emergency Medicine | Admitting: Emergency Medicine

## 2018-01-01 ENCOUNTER — Emergency Department (HOSPITAL_COMMUNITY): Payer: Commercial Managed Care - PPO

## 2018-01-01 DIAGNOSIS — R0981 Nasal congestion: Secondary | ICD-10-CM | POA: Diagnosis present

## 2018-01-01 DIAGNOSIS — J069 Acute upper respiratory infection, unspecified: Secondary | ICD-10-CM | POA: Insufficient documentation

## 2018-01-01 DIAGNOSIS — Z79899 Other long term (current) drug therapy: Secondary | ICD-10-CM | POA: Diagnosis not present

## 2018-01-01 DIAGNOSIS — F17200 Nicotine dependence, unspecified, uncomplicated: Secondary | ICD-10-CM | POA: Insufficient documentation

## 2018-01-01 MED ORDER — FLUTICASONE PROPIONATE 50 MCG/ACT NA SUSP: 2.0000 | Freq: Every day | NASAL | 0 refills | Status: AC

## 2018-01-01 MED ORDER — ALBUTEROL SULFATE HFA 108 (90 BASE) MCG/ACT IN AERS: 1.0000 | INHALATION_SPRAY | Freq: Four times a day (QID) | RESPIRATORY_TRACT | 0 refills | Status: AC | PRN

## 2018-01-01 MED ORDER — BENZONATATE 100 MG PO CAPS: 100.0000 mg | ORAL_CAPSULE | Freq: Three times a day (TID) | ORAL | 0 refills | Status: DC

## 2018-01-01 NOTE — Discharge Instructions (Signed)

## 2018-01-01 NOTE — ED Provider Notes (Signed)
[facility] EMERGENCY DEPARTMENT Provider Note   CSN: [id number] Arrival date & time: 01/01/18  0708     History   Chief Complaint Chief Complaint  Patient presents with  . Nasal Congestion  . Cough    HPI David Hayes is a 32 y.o. male.  HPI   Patient is a 32 year old male with no significant past medical history presents emergency department today for evaluation of URI symptoms.  Patient states he has had nasal congestion rhinorrhea, postnasal drip and sinus pressure/pain for the last 4 days.  Today he woke up with chest congestion and a dry cough.  Reports sweats last night but no documented fevers at home.  No sore throat or ear pain.  He had one episode of emesis this morning and has had no continued episodes of emesis.  No abdominal pain, diarrhea constipation or bloody stools.  No urinary sxs. Has not taken any medications for his pain.  Denies exacerbating or alleviating factors.  History reviewed. No pertinent past medical history.  There are no active problems to display for this patient.   Past Surgical History:  Procedure Laterality Date  . SHOULDER ARTHROSCOPY W/ LABRAL REPAIR          Home Medications    Prior to Admission medications   Medication Sig Start Date End Date Taking? Authorizing Provider  albuterol (PROVENTIL HFA;VENTOLIN HFA) 108 (90 Base) MCG/ACT inhaler Inhale 1-2 puffs into the lungs every 6 (six) hours as needed for wheezing or shortness of breath. 01/01/18   Forney Kleinpeter S, PA-C  benzonatate (TESSALON) 100 MG capsule Take 1 capsule (100 mg total) by mouth every 8 (eight) hours. 01/01/18   Amyri Frenz S, PA-C  fluticasone (FLONASE) 50 MCG/ACT nasal spray Place 2 sprays into both nostrils daily. 01/01/18   Ying Rocks S, PA-C  ibuprofen (ADVIL,MOTRIN) 200 MG tablet Take 400 mg by mouth every 6 (six) hours as needed for mild pain.    [provider]  ondansetron (ZOFRAN) 4 MG tablet Take 1 tablet (4 mg  total) by mouth every 6 (six) hours. 11/23/17   Eyvonne Mechanic, PA-C    Family History History reviewed. No pertinent family history.  Social History Social History   Tobacco Use  . Smoking status: Light Tobacco Smoker  . Smokeless tobacco: Never Used  Substance Use Topics  . Alcohol use: Yes    Comment: occasionally  . Drug use: No     Allergies   Patient has no known allergies.   Review of Systems Review of Systems  Constitutional: Positive for diaphoresis. Negative for chills and fever.  HENT: Positive for congestion, postnasal drip, rhinorrhea, sinus pressure and sinus pain.   Eyes: Negative for visual disturbance.  Respiratory: Positive for cough. Negative for shortness of breath.   Cardiovascular: Negative for chest pain.  Gastrointestinal: Positive for nausea and vomiting. Negative for abdominal pain, blood in stool, constipation and diarrhea.  Genitourinary: Negative for dysuria, frequency and urgency.  Musculoskeletal: Negative for back pain.  Skin: Negative for wound.  Neurological: Negative for headaches.    Physical Exam Updated Vital Signs BP 102/61 (BP Location: Right Arm)   Pulse 74   Temp 97.6 F (36.4 C) (Oral)   Resp 16   SpO2 99%   Physical Exam  Constitutional: He is oriented to person, place, and time. He appears well-developed and well-nourished. No distress.  HENT:  Right Ear: External ear normal.  Left Ear: External ear normal.  Mouth/Throat: Oropharynx is clear and  moist.  No pharyngeal erythema.  No tonsillar swelling or exudates.  Moist because membranes.  Bilateral nasal turbinates are swollen, right greater than left.  Eyes: Pupils are equal, round, and reactive to light. Conjunctivae and EOM are normal.  Neck: Neck supple.  Cardiovascular: Normal rate, regular rhythm, normal heart sounds and intact distal pulses.  Pulmonary/Chest: Effort normal and breath sounds normal. No stridor. No respiratory distress. He has no wheezes. He has  no rales.  Abdominal: Soft. Bowel sounds are normal. He exhibits no distension. There is no tenderness. There is no guarding.  Musculoskeletal: He exhibits no edema.  Lymphadenopathy:    He has no cervical adenopathy.  Neurological: He is alert and oriented to person, place, and time.  Skin: Skin is warm and dry.   ED Treatments / Results  Labs (all labs ordered are listed, but only abnormal results are displayed) Labs Reviewed - No data to display  EKG None  Radiology Dg Chest 2 View  Result Date: 01/01/2018 CLINICAL DATA:  Awakened this morning feeling hot and sweaty and congested. Discontinued smoking several months ago. EXAM: CHEST - 2 VIEW COMPARISON:  Chest x-rays of November 20, 2017 and October 18, 2017 included with rib detail images. FINDINGS: The lungs are mildly hyperinflated. There is hemidiaphragm flattening. There is no focal infiltrate. There is no pleural effusion. The heart and pulmonary vascularity are normal. The mediastinum is normal in width. The trachea is midline. The bony thorax exhibits no acute abnormality. IMPRESSION: Mild hyperinflation may be voluntary or may reflect some air trapping as might be seen with acute bronchitis or reactive airway disease. No acute pneumonia. Electronically Signed   By: David  [place] M.D.   On: 01/01/2018 09:01    Procedures Procedures (including critical care time)  Medications Ordered in ED Medications - No data to display   Initial Impression / Assessment and Plan / ED Course  I have reviewed the triage vital signs and the nursing notes.  Pertinent labs & imaging results that were available during my care of the patient were reviewed by me and considered in my medical decision making (see chart for details).     Final Clinical Impressions(s) / ED Diagnoses   Final diagnoses:  Upper respiratory tract infection, unspecified type   Pt CXR negative for acute infiltrate. Patients symptoms are consistent with URI, likely viral  etiology. Discussed that antibiotics are not indicated for viral infections. Pt will be discharged with symptomatic treatment.  Verbalizes understanding and is agreeable with plan. Pt is hemodynamically stable & in NAD prior to dc.  ED Discharge Orders         Ordered    fluticasone (FLONASE) 50 MCG/ACT nasal spray  Daily     01/01/18 0921    benzonatate (TESSALON) 100 MG capsule  Every 8 hours     01/01/18 0921    albuterol (PROVENTIL HFA;VENTOLIN HFA) 108 (90 Base) MCG/ACT inhaler  Every 6 hours PRN     01/01/18 0921           Karrie Meres, PA-C 01/01/18 0921    Melene Plan, DO 01/01/18 1610

## 2018-01-01 NOTE — ED Triage Notes (Signed)
Pt to ER for evaluation of waking up with nasal congestion, facial pain, dry cough, and chills. Pt in NAD.

## 2018-01-10 ENCOUNTER — Emergency Department (HOSPITAL_COMMUNITY)
Admission: EM | Admit: 2018-01-10 | Discharge: 2018-01-10 | Disposition: A | Discharge status: Home / Self Care | Payer: Commercial Managed Care - PPO | Attending: Emergency Medicine | Admitting: Emergency Medicine

## 2018-01-10 ENCOUNTER — Encounter (HOSPITAL_COMMUNITY): Payer: Self-pay | Admitting: *Deleted

## 2018-01-10 DIAGNOSIS — Z79899 Other long term (current) drug therapy: Secondary | ICD-10-CM | POA: Diagnosis not present

## 2018-01-10 DIAGNOSIS — J069 Acute upper respiratory infection, unspecified: Secondary | ICD-10-CM | POA: Diagnosis not present

## 2018-01-10 DIAGNOSIS — J029 Acute pharyngitis, unspecified: Secondary | ICD-10-CM | POA: Diagnosis present

## 2018-01-10 DIAGNOSIS — F17200 Nicotine dependence, unspecified, uncomplicated: Secondary | ICD-10-CM | POA: Insufficient documentation

## 2018-01-10 LAB — GROUP A STREP BY PCR: Group A Strep by PCR: NOT DETECTED

## 2018-01-10 NOTE — ED Provider Notes (Signed)
[facility] EMERGENCY DEPARTMENT Provider Note   CSN: [id number] Arrival date & time: 01/10/18  1704     History   Chief Complaint Chief Complaint  Patient presents with  . Sore Throat    HPI David Hayes is a 32 y.o. male.  David Hayes is a 32 y.o. Male who is otherwise healthy, woke up this morning with a sore throat.  He reports it feels irritated and raw and is painful to swallow but he has no difficulty eating and drinking.  He also reports for the past 3 days he has had some nasal drainage.  He reports with sore throat he also started having a dry cough this morning.  No fevers or chills.  No chest pain or shortness of breath, no abdominal pain, nausea or vomiting.  Patient has done nothing to treat symptoms prior to arrival.  Denies any other aggravating or alleviating factors.  No known sick contacts.     History reviewed. No pertinent past medical history.  There are no active problems to display for this patient.   Past Surgical History:  Procedure Laterality Date  . SHOULDER ARTHROSCOPY W/ LABRAL REPAIR          Home Medications    Prior to Admission medications   Medication Sig Start Date End Date Taking? Authorizing Provider  albuterol (PROVENTIL HFA;VENTOLIN HFA) 108 (90 Base) MCG/ACT inhaler Inhale 1-2 puffs into the lungs every 6 (six) hours as needed for wheezing or shortness of breath. 01/01/18   Couture, Cortni S, PA-C  benzonatate (TESSALON) 100 MG capsule Take 1 capsule (100 mg total) by mouth every 8 (eight) hours. 01/01/18   Couture, Cortni S, PA-C  fluticasone (FLONASE) 50 MCG/ACT nasal spray Place 2 sprays into both nostrils daily. 01/01/18   Couture, Cortni S, PA-C  ibuprofen (ADVIL,MOTRIN) 200 MG tablet Take 400 mg by mouth every 6 (six) hours as needed for mild pain or moderate pain.     [provider]  ondansetron (ZOFRAN) 4 MG tablet Take 1 tablet (4 mg total) by mouth every 6 (six) hours. Patient not taking:  Reported on 01/01/2018 11/23/17   Eyvonne Mechanic, PA-C    Family History No family history on file.  Social History Social History   Tobacco Use  . Smoking status: Light Tobacco Smoker  . Smokeless tobacco: Never Used  Substance Use Topics  . Alcohol use: Yes    Comment: occasionally  . Drug use: No     Allergies   Patient has no known allergies.   Review of Systems Review of Systems  Constitutional: Negative for chills and fever.  HENT: Positive for congestion, rhinorrhea and sore throat. Negative for ear pain.   Eyes: Negative for discharge, redness and itching.  Respiratory: Positive for cough. Negative for chest tightness, shortness of breath and wheezing.   Cardiovascular: Negative for chest pain.  Gastrointestinal: Negative for abdominal pain, nausea and vomiting.  Musculoskeletal: Negative for arthralgias and myalgias.  Skin: Negative for color change and rash.  Neurological: Negative for light-headedness and headaches.     Physical Exam Updated Vital Signs BP 102/61 (BP Location: Right Arm)   Pulse (!) 54   Temp (!) 97.3 F (36.3 C) (Oral)   Resp 17   SpO2 100%   Physical Exam  Constitutional: He appears well-developed and well-nourished.  Non-toxic appearance. He does not appear ill. No distress.  HENT:  Head: Normocephalic and atraumatic.  Mouth/Throat: Uvula is midline.  TMs clear with good landmarks,  moderate nasal mucosa edema with clear rhinorrhea, posterior oropharynx clear and moist, with some erythema, no edema or exudates, uvula midline  Eyes: Right eye exhibits no discharge. Left eye exhibits no discharge.  Neck: Neck supple.  Cardiovascular: Normal rate, regular rhythm, normal heart sounds and intact distal pulses. Exam reveals no gallop and no friction rub.  No murmur heard. Pulmonary/Chest: Effort normal and breath sounds normal. No respiratory distress.  Respirations equal and unlabored, patient able to speak in full sentences, lungs  clear to auscultation bilaterally  Abdominal: Soft. Bowel sounds are normal. He exhibits no distension and no mass. There is no tenderness. There is no guarding.  Abdomen soft, nondistended, nontender to palpation in all quadrants without guarding or peritoneal signs  Lymphadenopathy:    He has no cervical adenopathy.  Neurological: He is alert. Coordination normal.  Skin: Skin is warm and dry. Capillary refill takes less than 2 seconds. He is not diaphoretic.  Psychiatric: He has a normal mood and affect. His behavior is normal.  Nursing note and vitals reviewed.    ED Treatments / Results  Labs (all labs ordered are listed, but only abnormal results are displayed) Labs Reviewed  GROUP A STREP BY PCR    EKG None  Radiology No results found.  Procedures Procedures (including critical care time)  Medications Ordered in ED Medications - No data to display   Initial Impression / Assessment and Plan / ED Course  I have reviewed the triage vital signs and the nursing notes.  Pertinent labs & imaging results that were available during my care of the patient were reviewed by me and considered in my medical decision making (see chart for details).  Pt presents with nasal congestion, sore throat and cough. Pt is well appearing and vitals are normal. Lungs CTA on exam. Feel pneumonia is unlikely. Rapid strep negative. Patients symptoms are consistent with URI, likely viral etiology. Discussed that antibiotics are not indicated for viral infections. Pt will be discharged with symptomatic treatment.  Verbalizes understanding and is agreeable with plan. Pt is hemodynamically stable & in NAD prior to dc.   Final Clinical Impressions(s) / ED Diagnoses   Final diagnoses:  Viral upper respiratory tract infection    ED Discharge Orders    None       Dartha Lodge, Cordelia Poche 01/10/18 2029    Virgina Norfolk, DO 01/11/18 [phone]

## 2018-01-10 NOTE — Discharge Instructions (Addendum)
Your symptoms are likely caused by a viral upper respiratory infection. Antibiotics are not helpful in treating viral infection, the virus should run its course in about 5-7 days. Please make sure you are drinking plenty of fluids. You can treat your symptoms supportively with tylenol/ibuprofen for fevers and pains, Zyrtec and Flonase to help with nasal congestion, and over the counter cough syrups and throat lozenges to help with cough. Cepacol throat lozenges for sore throat. If your symptoms are not improving please follow up with you Primary doctor.   If you develop persistent fevers, shortness of breath or difficulty breathing, chest pain, severe headache and neck pain, persistent nausea and vomiting or other new or concerning symptoms return to the Emergency department.'

## 2018-01-10 NOTE — ED Triage Notes (Signed)
Woke this am with sore throat. Complains of sinus drainage for past 3 days. Throat is slightly red but without exudate noted. Appears in nad.

## 2018-01-31 ENCOUNTER — Emergency Department (HOSPITAL_COMMUNITY): Payer: Commercial Managed Care - PPO

## 2018-01-31 ENCOUNTER — Other Ambulatory Visit: Payer: Self-pay

## 2018-01-31 ENCOUNTER — Emergency Department (HOSPITAL_COMMUNITY)
Admission: EM | Admit: 2018-01-31 | Discharge: 2018-01-31 | Disposition: A | Discharge status: Home / Self Care | Payer: Commercial Managed Care - PPO | Attending: Emergency Medicine | Admitting: Emergency Medicine

## 2018-01-31 DIAGNOSIS — Z79899 Other long term (current) drug therapy: Secondary | ICD-10-CM | POA: Insufficient documentation

## 2018-01-31 DIAGNOSIS — J069 Acute upper respiratory infection, unspecified: Secondary | ICD-10-CM | POA: Insufficient documentation

## 2018-01-31 DIAGNOSIS — Z720 Tobacco use: Secondary | ICD-10-CM | POA: Insufficient documentation

## 2018-01-31 DIAGNOSIS — R0789 Other chest pain: Secondary | ICD-10-CM

## 2018-01-31 DIAGNOSIS — R079 Chest pain, unspecified: Secondary | ICD-10-CM | POA: Diagnosis present

## 2018-01-31 LAB — CBC
HEMATOCRIT: 46.8 % (ref 39.0–52.0)
HEMOGLOBIN: 15 g/dL (ref 13.0–17.0)
MCH: 28.3 pg (ref 26.0–34.0)
MCHC: 32.1 g/dL (ref 30.0–36.0)
MCV: 88.3 fL (ref 80.0–100.0)
NRBC: 0 % (ref 0.0–0.2)
PLATELETS: 259 10*3/uL (ref 150–400)
RBC: 5.3 MIL/uL (ref 4.22–5.81)
RDW: 12.9 % (ref 11.5–15.5)
WBC: 8.1 10*3/uL (ref 4.0–10.5)

## 2018-01-31 LAB — BASIC METABOLIC PANEL
ANION GAP: 7 (ref 5–15)
BUN: 11 mg/dL (ref 6–20)
CHLORIDE: 104 mmol/L (ref 98–111)
CO2: 26 mmol/L (ref 22–32)
Calcium: 9.4 mg/dL (ref 8.9–10.3)
Creatinine, Ser: 0.84 mg/dL (ref 0.61–1.24)
GFR calc non Af Amer: >60 mL/min (ref >60)
GLUCOSE: 89 mg/dL (ref 70–99)
POTASSIUM: 3.4 mmol/L — AB (ref 3.5–5.1)
Sodium: 137 mmol/L (ref 135–145)

## 2018-01-31 LAB — I-STAT TROPONIN, ED: Troponin i, poc: 0.01 ng/mL (ref 0.00–0.08)

## 2018-01-31 NOTE — ED Notes (Signed)
ED Provider at bedside. 

## 2018-01-31 NOTE — ED Notes (Signed)
Patient verbalizes understanding of discharge instructions. Opportunity for questioning and answers were provided. Armband removed by staff, pt discharged from ED.  

## 2018-01-31 NOTE — Discharge Instructions (Addendum)
Your symptoms today appear to be in the chest wall and associated with upper respiratory infection Return to the emergency department if you have worsening pain, shortness of breath, or fever

## 2018-01-31 NOTE — ED Triage Notes (Signed)
Pt stated, I was here 3 weeks ago for the same symptoms

## 2018-01-31 NOTE — ED Provider Notes (Signed)
[facility] EMERGENCY DEPARTMENT Provider Note   CSN: [id number] Arrival date & time: 01/31/18  1713     History   Chief Complaint Chief Complaint  Patient presents with  . Chest Pain  . Nasal Congestion  . Cough    HPI David Hayes is a 32 y.o. male.  HPI  32 year old previously healthy male presents today complaining of nasal congestion, cough, with some lower chest pain associated with the cough.  He noted the symptoms that began this morning.  The cough is been nonproductive.  He had some subjective fever earlier but has not had any fever later in the day.  He has taken no medications.  He denies any smoking or vaping.  He denies lateralized leg pain, DVT, or PE risk factors.  He describes the pain as sharp pain in the lower chest with coughing and intermittent in nature.  Not having any pain at this time.  Some sore throat.  No past medical history on file.  There are no active problems to display for this patient.   Past Surgical History:  Procedure Laterality Date  . SHOULDER ARTHROSCOPY W/ LABRAL REPAIR          Home Medications    Prior to Admission medications   Medication Sig Start Date End Date Taking? Authorizing Provider  albuterol (PROVENTIL HFA;VENTOLIN HFA) 108 (90 Base) MCG/ACT inhaler Inhale 1-2 puffs into the lungs every 6 (six) hours as needed for wheezing or shortness of breath. Patient not taking: Reported on 01/31/2018 01/01/18   Couture, Cortni S, PA-C  benzonatate (TESSALON) 100 MG capsule Take 1 capsule (100 mg total) by mouth every 8 (eight) hours. Patient not taking: Reported on 01/31/2018 01/01/18   Couture, Cortni S, PA-C  fluticasone (FLONASE) 50 MCG/ACT nasal spray Place 2 sprays into both nostrils daily. Patient not taking: Reported on 01/31/2018 01/01/18   Couture, Cortni S, PA-C  ondansetron (ZOFRAN) 4 MG tablet Take 1 tablet (4 mg total) by mouth every 6 (six) hours. Patient not taking: Reported on 01/01/2018  11/23/17   Eyvonne Mechanic, PA-C    Family History No family history on file.  Social History Social History   Tobacco Use  . Smoking status: Light Tobacco Smoker  . Smokeless tobacco: Never Used  Substance Use Topics  . Alcohol use: Yes    Comment: occasionally  . Drug use: No     Allergies   Patient has no known allergies.   Review of Systems Review of Systems  All other systems reviewed and are negative.    Physical Exam Updated Vital Signs BP 114/68   Pulse (!) 52   Temp 97.8 F (36.6 C) (Oral)   Resp 19   Ht 1.829 m (6')   Wt 68 kg   SpO2 100%   BMI 20.34 kg/m   Physical Exam  Constitutional: He is oriented to person, place, and time. He appears well-developed and well-nourished.  HENT:  Head: Normocephalic.  Some mild erythema in throat  Eyes: Pupils are equal, round, and reactive to light.  Neck: Normal range of motion.  Cardiovascular: Normal rate, regular rhythm, intact distal pulses and normal pulses.    No systolic murmur is present.  No diastolic murmur is present. Pulmonary/Chest: Effort normal and breath sounds normal.  Abdominal: Soft. Bowel sounds are normal.  Musculoskeletal: Normal range of motion.       Right lower leg: Normal.       Left lower leg: Normal.  Neurological: He is  alert and oriented to person, place, and time.  Skin: Skin is warm. Capillary refill takes less than 2 seconds.  Psychiatric: He has a normal mood and affect. His behavior is normal.  Nursing note and vitals reviewed.    ED Treatments / Results  Labs (all labs ordered are listed, but only abnormal results are displayed) Labs Reviewed  BASIC METABOLIC PANEL - Abnormal; Notable for the following components:      Result Value   Potassium 3.4 (*)    All other components within normal limits  CBC  I-STAT TROPONIN, ED    EKG EKG Interpretation  Date/Time:  Wednesday January 31 2018 18:03:46 EDT Ventricular Rate:  61 PR Interval:  122 QRS  Duration: 102 QT Interval:  397 QTC Calculation: 400 R Axis:   84 Text Interpretation:  Normal sinus rhythm Normal ECG Confirmed by Margarita Grizzle [phone]) on 01/31/2018 6:53:23 PM   Radiology Dg Chest 2 View  Result Date: 01/31/2018 CLINICAL DATA:  Chest pain EXAM: CHEST - 2 VIEW COMPARISON:  01/01/2018 FINDINGS: The heart size and mediastinal contours are within normal limits. Both lungs are clear. The visualized skeletal structures are unremarkable. IMPRESSION: No active cardiopulmonary disease. Electronically Signed   By: Deatra Robinson M.D.   On: 01/31/2018 18:10    Procedures Procedures (including critical care time)  Medications Ordered in ED Medications - No data to display   Initial Impression / Assessment and Plan / ED Course  I have reviewed the triage vital signs and the nursing notes.  Pertinent labs & imaging results that were available during my care of the patient were reviewed by me and considered in my medical decision making (see chart for details).     32 year old male with URI symptoms and associated chest pain with coughing.  EKG normal and have low index of suspicion for cardiac disease, lung lung disease, PE, or intra-abdominal abnormality.  Patient appears stable for discharge.  Final Clinical Impressions(s) / ED Diagnoses   Final diagnoses:  Viral upper respiratory tract infection  Chest wall pain    ED Discharge Orders    None       Margarita Grizzle, MD 01/31/18 1902

## 2018-01-31 NOTE — ED Provider Notes (Signed)
Patient placed in Quick Look pathway, seen and evaluated   Chief Complaint: pain in mid chest and cough  HPI:   Pt reports coughing and pain in mid chest afterwards  ROS: sinus drainage, no fever   Physical Exam:   Gen: No distress  Neuro: Awake and Alert  Skin: Warm    Focused Exam:  Lungs clear Heart rrr   Initiation of care has begun. The patient has been counseled on the process, plan, and necessity for staying for the completion/evaluation, and the remainder of the medical screening examination   Elson Areas, PA-C 01/31/18 1739    Virgina Norfolk, DO 02/01/18 1610

## 2018-01-31 NOTE — ED Triage Notes (Signed)
Pt. Stated, Ive had chest pressure this morning, and my nose has been stopped up for 2 days . Ive had a cough also.

## 2018-02-12 ENCOUNTER — Emergency Department (HOSPITAL_COMMUNITY): Payer: Commercial Managed Care - PPO

## 2018-02-12 ENCOUNTER — Emergency Department (HOSPITAL_COMMUNITY)
Admission: EM | Admit: 2018-02-12 | Discharge: 2018-02-12 | Disposition: A | Discharge status: Home / Self Care | Payer: Commercial Managed Care - PPO | Attending: Emergency Medicine | Admitting: Emergency Medicine

## 2018-02-12 ENCOUNTER — Other Ambulatory Visit: Payer: Self-pay

## 2018-02-12 ENCOUNTER — Encounter (HOSPITAL_COMMUNITY): Payer: Self-pay

## 2018-02-12 DIAGNOSIS — F17200 Nicotine dependence, unspecified, uncomplicated: Secondary | ICD-10-CM | POA: Insufficient documentation

## 2018-02-12 DIAGNOSIS — R070 Pain in throat: Secondary | ICD-10-CM | POA: Diagnosis present

## 2018-02-12 DIAGNOSIS — J069 Acute upper respiratory infection, unspecified: Secondary | ICD-10-CM | POA: Insufficient documentation

## 2018-02-12 DIAGNOSIS — R05 Cough: Secondary | ICD-10-CM | POA: Insufficient documentation

## 2018-02-12 LAB — GROUP A STREP BY PCR: Group A Strep by PCR: NOT DETECTED

## 2018-02-12 MED ORDER — LIDOCAINE VISCOUS HCL 2 % MT SOLN: 15.0000 mL | OROMUCOSAL | 0 refills | Status: AC | PRN

## 2018-02-12 NOTE — ED Notes (Signed)
Pt dc'd home with all belongings, a/o x4, ambulatory on dc, no narcotics given in ed 

## 2018-02-12 NOTE — ED Triage Notes (Signed)
Pt reports sore throat since yesterday. States he was working outside in the cold air Saturday and ever since he has felt bad.

## 2018-02-12 NOTE — Discharge Instructions (Addendum)
You have been diagnosed today with viral upper respiratory tract infection.  At this time there does not appear to be the presence of an emergent medical condition, however there is always the potential for conditions to change. Please read and follow the below instructions.  Please return to the Emergency Department immediately for any new or worsening symptoms or if your symptoms do not improve. Please be sure to follow up with your Primary Care Provider as soon as possible regarding your visit today; please call their office to schedule an appointment even if you are feeling better for a follow-up visit. You may use the lidocaine mouthwash as prescribed to help with your pain. Do not swallow this medication. Please drink plenty of water and get plenty of rest over the next few days to help with your symptoms.  Get help if: Your symptoms last for 10 days or longer. Your symptoms get worse over time. You have a fever. You have very bad pain in your face or forehead. Parts of your jaw or neck become very swollen. Get help right away if: You feel pain or pressure in your chest. You have shortness of breath. You faint or feel like you will faint. You keep throwing up (vomiting). You feel confused.  Please read the additional information packets attached to your discharge summary.  Do not take your medicine if  develop an itchy rash, swelling in your mouth or lips, or difficulty breathing.

## 2018-02-12 NOTE — ED Provider Notes (Signed)
Medical screening examination/treatment/procedure(s) were conducted as a shared visit with non-physician practitioner(s) and myself.  I personally evaluated the patient during the encounter.  Clinical Impression:   Final diagnoses:  Viral URI    The patient is a 32 year old male, otherwise healthy, started having a sore throat a couple of nights ago, has gradually worsened and is associated with a cough.  He has felt febrile but does not have a fever here.  He has been coughing with this.  On exam his lungs are clear, throat is erythematous but there is no exudate on the tonsils, there is no uvular deviation and his phonation is normal.  No lymphadenopathy of the neck, no torticollis or trismus.  Patient is well-appearing, stable for discharge, strep test pending, x-ray negative.  Patient agreeable to be discharged on an anti-inflammatory and possibly lidocaine.  Penicillin if strep positive   Eber Hong, MD 02/13/18 [phone]

## 2018-02-12 NOTE — ED Provider Notes (Signed)
[facility] EMERGENCY DEPARTMENT Provider Note   CSN: [id number] Arrival date & time: 02/12/18  1530     History   Chief Complaint Chief Complaint  Patient presents with  . Sore Throat    HPI David Hayes is a 32 y.o. male presented today for sore throat and cough that began yesterday morning upon awakening.  Patient states that he was at a football game on Saturday night and symptoms develop the next day.  Denies fever.  States that his sore throat is a moderate intensity burning sensation on both sides of his throat that is constant and worsened with swallowing.  Patient has not taken medications for his symptoms.  Patient denies drooling, voice change, difficulty swallowing food/water, nausea or vomiting.  Patient describes cough as a mild nonproductive cough that has been intermittent since yesterday.  Denies pain associated with his cough. Denies hemoptysis.  Denies leg swelling.  HPI  History reviewed. No pertinent past medical history.  There are no active problems to display for this patient.   Past Surgical History:  Procedure Laterality Date  . SHOULDER ARTHROSCOPY W/ LABRAL REPAIR          Home Medications    Prior to Admission medications   Medication Sig Start Date End Date Taking? Authorizing Provider  albuterol (PROVENTIL HFA;VENTOLIN HFA) 108 (90 Base) MCG/ACT inhaler Inhale 1-2 puffs into the lungs every 6 (six) hours as needed for wheezing or shortness of breath. Patient not taking: Reported on 01/31/2018 01/01/18   Couture, Cortni S, PA-C  benzonatate (TESSALON) 100 MG capsule Take 1 capsule (100 mg total) by mouth every 8 (eight) hours. Patient not taking: Reported on 01/31/2018 01/01/18   Couture, Cortni S, PA-C  fluticasone (FLONASE) 50 MCG/ACT nasal spray Place 2 sprays into both nostrils daily. Patient not taking: Reported on 01/31/2018 01/01/18   Couture, Cortni S, PA-C  lidocaine (XYLOCAINE) 2 % solution Use as directed 15 mLs  in the mouth or throat as needed for mouth pain. 02/12/18   Eber Hong, MD  ondansetron (ZOFRAN) 4 MG tablet Take 1 tablet (4 mg total) by mouth every 6 (six) hours. Patient not taking: Reported on 01/01/2018 11/23/17   Eyvonne Mechanic, PA-C    Family History No family history on file.  Social History Social History   Tobacco Use  . Smoking status: Light Tobacco Smoker  . Smokeless tobacco: Never Used  Substance Use Topics  . Alcohol use: Yes    Comment: occasionally  . Drug use: No     Allergies   Patient has no known allergies.   Review of Systems Review of Systems  Constitutional: Negative.  Negative for chills and fever.  HENT: Positive for congestion, rhinorrhea and sore throat. Negative for ear pain, facial swelling, trouble swallowing and voice change.   Respiratory: Positive for cough. Negative for shortness of breath and wheezing.   Cardiovascular: Negative.  Negative for chest pain and leg swelling.  Gastrointestinal: Negative.  Negative for nausea and vomiting.  Neurological: Negative.  Negative for headaches.    Physical Exam Updated Vital Signs BP 112/71 (BP Location: Right Arm)   Pulse 60   Temp 97.8 F (36.6 C) (Oral)   Resp 18   Ht 6' (1.829 m)   Wt 68 kg   SpO2 100%   BMI 20.34 kg/m   Physical Exam  Constitutional: He is oriented to person, place, and time. He appears well-developed and well-nourished. No distress.  HENT:  Head: Normocephalic and atraumatic.  Right Ear: Hearing, tympanic membrane, external ear and ear canal normal.  Left Ear: Hearing, tympanic membrane, external ear and ear canal normal.  Nose: Nose normal.  Mouth/Throat: Uvula is midline and mucous membranes are normal. No trismus in the jaw. No uvula swelling. No oropharyngeal exudate, posterior oropharyngeal edema or tonsillar abscesses. Tonsils are 1+ on the right. Tonsils are 1+ on the left. No tonsillar exudate.  The patient has normal phonation and is in control of  secretions. No stridor.  Midline uvula without edema. Soft palate rises symmetrically. Mild tonsillar erythema, no tonsillar swelling or exudates. Tongue protrusion is normal, floor of mouth is soft. No trismus. No creptius on neck palpation. No gingival erythema or fluctuance noted. Mucus membranes moist. No pallor noted.  Eyes: Pupils are equal, round, and reactive to light. EOM are normal.  Neck: Trachea normal, normal range of motion, full passive range of motion without pain and phonation normal. Neck supple. No neck rigidity. No tracheal deviation, no edema and no erythema present.  Cardiovascular: Normal rate, regular rhythm, normal heart sounds and intact distal pulses.  Pulmonary/Chest: Effort normal and breath sounds normal. No respiratory distress. He has no wheezes.  Abdominal: Soft. There is no tenderness. There is no rigidity, no rebound and no guarding.  Musculoskeletal: Normal range of motion.  Lymphadenopathy:    He has cervical adenopathy.  Neurological: He is alert and oriented to person, place, and time.  Speech is clear and goal oriented, follows commands Major Cranial nerves without deficit, no facial droop Sensation normal to light touch Moves extremities without ataxia, coordination intact Normal gait  Skin: Skin is warm and dry.  Psychiatric: He has a normal mood and affect. His behavior is normal.     ED Treatments / Results  Labs (all labs ordered are listed, but only abnormal results are displayed) Labs Reviewed  GROUP A STREP BY PCR    EKG None  Radiology Dg Chest 2 View  Result Date: 02/12/2018 CLINICAL DATA:  32 year old male with a history of cough and congestion EXAM: CHEST - 2 VIEW COMPARISON:  01/31/2018 FINDINGS: The heart size and mediastinal contours are within normal limits. Both lungs are clear. The visualized skeletal structures are unremarkable. IMPRESSION: Negative for acute cardiopulmonary disease Electronically Signed   By: Gilmer Mor  D.O.   On: 02/12/2018 16:18    Procedures Procedures (including critical care time)  Medications Ordered in ED Medications - No data to display   Initial Impression / Assessment and Plan / ED Course  I have reviewed the triage vital signs and the nursing notes.  Pertinent labs & imaging results that were available during my care of the patient were reviewed by me and considered in my medical decision making (see chart for details).    Patient with symptoms consistent with URI for 2 days.  Patient's CXR is negative for acute infiltrate.  Strep PCR negative.  Symptoms are likely of viral etiology. Discussed that antibiotics are not indicated for viral infections. Patient will be discharged with symptomatic treatment.  Viscous lidocaine mouthwash prescribed for comfort.  Patient verbalizes understanding and is agreeable with plan. Patient is hemodynamically stable and in no acute distress prior to discharge.  Afebrile, not tachycardic, not hypotensive well-appearing in no acute distress.  Patient is well-appearing, nontoxic, speaking well, handling secretions without difficulty and is able to swallow without difficulty.  No signs of peritonsillar abscess, Ludwig's angina, retropharyngeal abscess, preseptal/orbital cellulitis or any other deep tissue infections of the head/neck.  At this time there does not appear to be any evidence of an acute emergency medical condition and the patient appears stable for discharge with appropriate outpatient follow up. Diagnosis was discussed with patient who verbalizes understanding of care plan and is agreeable to discharge. I have discussed return precautions with patient who verbalizes understanding of return precautions. Patient strongly encouraged to follow-up with their PCP. All questions answered.  Patient was seen during this visit by Dr. Hyacinth Meeker who agrees with plan to discharge with PCP follow-up at this time.    Note: Portions of this report may  have been transcribed using voice recognition software. Every effort was made to ensure accuracy; however, inadvertent computerized transcription errors may still be present.  Final Clinical Impressions(s) / ED Diagnoses   Final diagnoses:  Viral URI    ED Discharge Orders         Ordered    lidocaine (XYLOCAINE) 2 % solution  As needed     02/12/18 1640           Bill Salinas, PA-C 02/12/18 1654    Eber Hong, MD 02/13/18 [phone]

## 2018-02-12 NOTE — ED Notes (Signed)
Patient transported to X-ray 

## 2018-02-15 ENCOUNTER — Other Ambulatory Visit: Payer: Self-pay

## 2018-02-15 ENCOUNTER — Emergency Department (HOSPITAL_COMMUNITY)
Admission: EM | Admit: 2018-02-15 | Discharge: 2018-02-15 | Disposition: A | Discharge status: Home / Self Care | Payer: Commercial Managed Care - PPO | Attending: Emergency Medicine | Admitting: Emergency Medicine

## 2018-02-15 DIAGNOSIS — F17210 Nicotine dependence, cigarettes, uncomplicated: Secondary | ICD-10-CM | POA: Diagnosis not present

## 2018-02-15 DIAGNOSIS — Z79899 Other long term (current) drug therapy: Secondary | ICD-10-CM | POA: Insufficient documentation

## 2018-02-15 DIAGNOSIS — R112 Nausea with vomiting, unspecified: Secondary | ICD-10-CM | POA: Diagnosis present

## 2018-02-15 DIAGNOSIS — J069 Acute upper respiratory infection, unspecified: Secondary | ICD-10-CM | POA: Diagnosis not present

## 2018-02-15 LAB — CBG MONITORING, ED: Glucose-Capillary: 113 mg/dL — ABNORMAL HIGH (ref 70–99)

## 2018-02-15 MED ORDER — ONDANSETRON 4 MG PO TBDP
4.0000 mg | ORAL_TABLET | Freq: Once | ORAL | Status: AC
  Administered 2018-02-15: 4 mg via ORAL
  Filled 2018-02-15: qty 1

## 2018-02-15 MED ORDER — ONDANSETRON 4 MG PO TBDP: 4.0000 mg | ORAL_TABLET | Freq: Three times a day (TID) | ORAL | 0 refills | Status: AC | PRN

## 2018-02-15 NOTE — ED Provider Notes (Signed)
[facility] EMERGENCY DEPARTMENT Provider Note   CSN: [id number] Arrival date & time: 02/15/18  9562     History   Chief Complaint Chief Complaint  Patient presents with  . Emesis  . Nausea    HPI David Hayes is a 32 y.o. male that sniffing and past medical history, presenting to the emergency department with subsequent visit for acute onset of nausea with nonbloody nonbilious emesis that began this morning.  Patient was recently seen on 02/12/2018 and diagnosed with a viral upper respiratory infection.  During that visit he had a negative chest x-ray and a negative strep by PCR.  He states his symptoms have been persistent, slightly improving.  Today on the way to work he began feeling nauseous and had one episode of emesis. Still has some mild nausea now.  He denies associated abdominal pain, diarrhea, constipation, fevers.  Denies difficulty breathing or swallowing.  Has been taking the viscous lidocaine as prescribed during last ED visit for sore throat, however no other over-the-counter medications.  The history is provided by the patient.    No past medical history on file.  There are no active problems to display for this patient.   Past Surgical History:  Procedure Laterality Date  . SHOULDER ARTHROSCOPY W/ LABRAL REPAIR          Home Medications    Prior to Admission medications   Medication Sig Start Date End Date Taking? Authorizing Provider  lidocaine (XYLOCAINE) 2 % solution Use as directed 15 mLs in the mouth or throat as needed for mouth pain. 02/12/18  Yes Eber Hong, MD  albuterol (PROVENTIL HFA;VENTOLIN HFA) 108 (90 Base) MCG/ACT inhaler Inhale 1-2 puffs into the lungs every 6 (six) hours as needed for wheezing or shortness of breath. Patient not taking: Reported on 01/31/2018 01/01/18   Couture, Cortni S, PA-C  benzonatate (TESSALON) 100 MG capsule Take 1 capsule (100 mg total) by mouth every 8 (eight) hours. Patient not taking:  Reported on 01/31/2018 01/01/18   Couture, Cortni S, PA-C  fluticasone (FLONASE) 50 MCG/ACT nasal spray Place 2 sprays into both nostrils daily. Patient not taking: Reported on 01/31/2018 01/01/18   Couture, Cortni S, PA-C  ondansetron (ZOFRAN ODT) 4 MG disintegrating tablet Take 1 tablet (4 mg total) by mouth every 8 (eight) hours as needed for nausea or vomiting. 02/15/18   Robinson, [place] N, PA-C  ondansetron (ZOFRAN) 4 MG tablet Take 1 tablet (4 mg total) by mouth every 6 (six) hours. Patient not taking: Reported on 01/01/2018 11/23/17   Eyvonne Mechanic, PA-C    Family History No family history on file.  Social History Social History   Tobacco Use  . Smoking status: Light Tobacco Smoker  . Smokeless tobacco: Never Used  Substance Use Topics  . Alcohol use: Yes    Comment: occasionally  . Drug use: No     Allergies   Patient has no known allergies.   Review of Systems Review of Systems  Constitutional: Negative for appetite change and fever.  HENT: Positive for congestion and sore throat. Negative for ear pain, trouble swallowing and voice change.   Respiratory: Negative for shortness of breath.   Gastrointestinal: Positive for nausea and vomiting. Negative for abdominal pain, constipation and diarrhea.  All other systems reviewed and are negative.    Physical Exam Updated Vital Signs BP 101/69   Pulse 60   Temp 97.8 F (36.6 C) (Oral)   Resp 18   Ht 6' (1.829 m)  Wt 68 kg   SpO2 100%   BMI 20.34 kg/m   Physical Exam  Constitutional: He appears well-developed and well-nourished. No distress.  Patient is not toxic appearing  HENT:  Head: Normocephalic and atraumatic.  Pharynx is mildly erythematous without swelling or exudates.  Uvula is midline without trismus.  Bilateral TMs are normal.  Nose sounds congested.  Eyes: Conjunctivae are normal.  Neck: Normal range of motion. Neck supple.  Mild anterior cervical adenopathy bilaterally.  Cardiovascular: Normal  rate, regular rhythm and normal heart sounds.  Pulmonary/Chest: Effort normal and breath sounds normal. No respiratory distress.  Abdominal: Soft. Bowel sounds are normal. He exhibits no distension and no mass. There is no tenderness. There is no rebound and no guarding.  Neurological: He is alert.  Skin: Skin is warm.  Psychiatric: He has a normal mood and affect. His behavior is normal.  Nursing note and vitals reviewed.    ED Treatments / Results  Labs (all labs ordered are listed, but only abnormal results are displayed) Labs Reviewed  CBG MONITORING, ED - Abnormal; Notable for the following components:      Result Value   Glucose-Capillary 113 (*)    All other components within normal limits    EKG None  Radiology No results found.  Procedures Procedures (including critical care time)  Medications Ordered in ED Medications  ondansetron (ZOFRAN-ODT) disintegrating tablet 4 mg (4 mg Oral Given 02/15/18 [id number])     Initial Impression / Assessment and Plan / ED Course  I have reviewed the triage vital signs and the nursing notes.  Pertinent labs & imaging results that were available during my care of the patient were reviewed by me and considered in my medical decision making (see chart for details).  Clinical Course as of Feb 15 737  Thu Feb 15, 2018  [id number] Patient reevaluated states nausea is improving and he is tolerating p.o.  Will discharge with symptomatic management and work note.   [JR]    Clinical Course User Index [JR] Robinson, [place] N, PA-C    Patient presenting to the emergency department with subsequent visit after recent viral URI diagnosis in the ED on 02/12/2018.  During that visit he had a negative chest x-ray negative strep by PCR.  Today he began having nausea with one episode of nonbloody nonbilious emesis.  No associated abdominal pain.  On exam, his vital signs are normal, afebrile.  Abdomen is soft and nontender.  Endorses some mild nausea  though not actively vomiting in the ED.  Lungs are clear.  ENT exam is reassuring.  CBG is 113.  Nausea treated.  I do believe this is still consistent with viral illness.  Do not believe blood work is indicated at this time with reassuring vital signs and exam findings.  Discussed with this with patient, and he is agreeable to plan for discharge with nausea medication and work note.  Discussed results, findings, treatment and follow up. Patient advised of return precautions. Patient verbalized understanding and agreed with plan.  Final Clinical Impressions(s) / ED Diagnoses   Final diagnoses:  Viral URI  Non-intractable vomiting with nausea, unspecified vomiting type    ED Discharge Orders         Ordered    ondansetron (ZOFRAN ODT) 4 MG disintegrating tablet  Every 8 hours PRN     02/15/18 0737           Robinson, [place] N, PA-C 02/15/18 [id number]    Azalia Bilisampos, Kevin, MD 02/15/18  0740  

## 2018-02-15 NOTE — ED Triage Notes (Signed)
Pt arrived POV d/t N/v that started this morning. Pt aeen on Monday d/t sore throat

## 2018-02-15 NOTE — ED Notes (Signed)
Pt CBG was 113, notified Jessica(RN) 

## 2018-02-15 NOTE — Discharge Instructions (Signed)
Please read instructions below. Stay hydrated. Drink clear liquids until your stomach feels better. Then, slowly introduce bland foods into your diet as tolerated, such as bread, rice, apples, bananas. Use saline nasal spray for congestion. You can take zofran every 8 hours as needed for nausea. Establish care with a primary care provider using the phone number in this discharge paperwork. Return to the ER for severe abdominal pain, fever, uncontrollable vomiting, inability to swallow liquids, difficulty breathing, or new or worsening symptoms.

## 2018-02-15 NOTE — ED Notes (Signed)
Pt states he unnderstands instructions and performs teach back. Pty to DC stable with steady gait.

## 2018-02-19 ENCOUNTER — Encounter (HOSPITAL_COMMUNITY): Payer: Self-pay | Admitting: Emergency Medicine

## 2018-02-19 ENCOUNTER — Emergency Department (HOSPITAL_COMMUNITY)
Admission: EM | Admit: 2018-02-19 | Discharge: 2018-02-19 | Disposition: A | Discharge status: Home / Self Care | Payer: Commercial Managed Care - PPO | Attending: Emergency Medicine | Admitting: Emergency Medicine

## 2018-02-19 ENCOUNTER — Other Ambulatory Visit: Payer: Self-pay

## 2018-02-19 DIAGNOSIS — M25561 Pain in right knee: Secondary | ICD-10-CM | POA: Insufficient documentation

## 2018-02-19 DIAGNOSIS — F17210 Nicotine dependence, cigarettes, uncomplicated: Secondary | ICD-10-CM | POA: Diagnosis not present

## 2018-02-19 MED ORDER — MELOXICAM 15 MG PO TABS: 15.0000 mg | ORAL_TABLET | Freq: Every day | ORAL | 0 refills | Status: AC

## 2018-02-19 NOTE — Discharge Instructions (Addendum)
Contact a health care provider if: °Your knee pain continues, changes, or gets worse. °You have a fever along with knee pain. °Your knee buckles or locks up. °Your knee swells, and the swelling becomes worse. °Get help right away if: °Your knee feels warm to the touch. °You cannot move your knee. °You have severe pain in your knee. °You have chest pain. °You have trouble breathing. °

## 2018-02-19 NOTE — ED Provider Notes (Signed)
[facility] Provider [facility]   CSN: [id number] Arrival date & time: 02/19/18  [id number]     History   Chief Complaint Chief Complaint  Patient presents with  . Knee Pain    HPI David Hayes is a 32 y.o. male presents the emergency Hayes chief complaint of knee pain.  Patient states that he was at the gym yesterday and was doing hip opening exercises.  When he abducted his hip rapidly and set his leg down he felt a snap or a buckling in the posterior and medial side of his leg.  He had immediate pain but was able to ambulate.  He states that he was doing well and does not have any significant pain with ambulation however this morning when he got out of bed and squatted deeply he had severe pain in that region.  He has been able to ambulate.  He denies any swelling he denies mechanical symptoms of catching, feelings of instability, popping.  He has no previous injuries to the knee.  HPI  History reviewed. No pertinent past medical history.  There are no active problems to display for this patient.   Past Surgical History:  Procedure Laterality Date  . SHOULDER ARTHROSCOPY W/ LABRAL REPAIR          Home Medications    Prior to Admission medications   Medication Sig Start Date End Date Taking? Authorizing Provider  albuterol (PROVENTIL HFA;VENTOLIN HFA) 108 (90 Base) MCG/ACT inhaler Inhale 1-2 puffs into the lungs every 6 (six) hours as needed for wheezing or shortness of breath. Patient not taking: Reported on 01/31/2018 01/01/18   Couture, Cortni S, PA-C  benzonatate (TESSALON) 100 MG capsule Take 1 capsule (100 mg total) by mouth every 8 (eight) hours. Patient not taking: Reported on 01/31/2018 01/01/18   Couture, Cortni S, PA-C  fluticasone (FLONASE) 50 MCG/ACT nasal spray Place 2 sprays into both nostrils daily. Patient not taking: Reported on 01/31/2018 01/01/18   Couture, Cortni S, PA-C  lidocaine (XYLOCAINE) 2 % solution Use as  directed 15 mLs in the mouth or throat as needed for mouth pain. 02/12/18   Eber HongMiller, Khyle, MD  meloxicam (MOBIC) 15 MG tablet Take 1 tablet (15 mg total) by mouth daily. With food. 02/19/18   Kajol Crispen, PA-C  ondansetron (ZOFRAN ODT) 4 MG disintegrating tablet Take 1 tablet (4 mg total) by mouth every 8 (eight) hours as needed for nausea or vomiting. 02/15/18   Robinson, [place] N, PA-C  ondansetron (ZOFRAN) 4 MG tablet Take 1 tablet (4 mg total) by mouth every 6 (six) hours. Patient not taking: Reported on 01/01/2018 11/23/17   Eyvonne MechanicHedges, Jeffrey, PA-C    Family History No family history on file.  Social History Social History   Tobacco Use  . Smoking status: Light Tobacco Smoker  . Smokeless tobacco: Never Used  Substance Use Topics  . Alcohol use: Yes    Comment: occasionally  . Drug use: No     Allergies   Patient has no known allergies.   Review of Systems Review of Systems  Musculoskeletal: Negative for gait problem and joint swelling.       Knee pain  Skin: Negative for color change.  Neurological: Negative for weakness and numbness.     Physical Exam Updated Vital Signs BP 126/79 (BP Location: Right Arm)   Pulse 79   Temp 97.8 F (36.6 C)   Resp 16   Wt 68 kg   SpO2 100%  BMI 20.34 kg/m   Physical Exam  Physical Exam  Nursing note and vitals reviewed. Constitutional: He appears well-developed and well-nourished. No distress.  HENT:  Head: Normocephalic and atraumatic.  Eyes: Conjunctivae normal are normal. No scleral icterus.  Neck: Normal range of motion. Neck supple.  Cardiovascular: Normal rate, regular rhythm and normal heart sounds.   Pulmonary/Chest: Effort normal and breath sounds normal. No respiratory distress.  Abdominal: Soft. There is no tenderness.  Musculoskeletal: He exhibits no edema. No effusion of the right knee.  No laxity of the MCL or LCL.  The patella does not have abnormal movement.  No white line tenderness, negative  McMurray's test, negative anterior posterior drawer.  Tenderness to palpation of the tendon tendons or the popliteal region.  Full active and passive range of motion, normal strength with flexion and extension of the knee Neurological: He is alert.  Skin: Skin is warm and dry. He is not diaphoretic.  Psychiatric: His behavior is normal.    ED Treatments / Results  Labs (all labs ordered are listed, but only abnormal results are displayed) Labs Reviewed - No data to display  EKG None  Radiology No results found.  Procedures Procedures (including critical care time)  Medications Ordered in ED Medications - No data to display   Initial Impression / Assessment and Plan / ED Course  I have reviewed the triage vital signs and the nursing notes.  Pertinent labs & imaging results that were available during my care of the patient were reviewed by me and considered in my medical decision making (see chart for details).     Patient with benign knee exam.  He has no tenderness, swelling, bruising.  His ligaments are stable.  I have no suspicion for internal derangement.  I suspect that the semi-tendinosis or semimembranosus tendons may have slipped and popped giving him the sensation he had.  Patient will be placed in a knee sleeve, urgent cryotherapy and anti-inflammatories and outpatient orthopedic or sports medicine follow-up.  Do not feel he needs any imaging at this time.  He is ambulatory and appears appropriate for discharge  Final Clinical Impressions(s) / ED Diagnoses   Final diagnoses:  Posterior knee pain, right    ED Discharge Orders         Ordered    meloxicam (MOBIC) 15 MG tablet  Daily     02/19/18 0706           Arthor Captain, PA-C 02/19/18 0754    Glynn Octave, MD 02/19/18 [phone]

## 2018-02-19 NOTE — ED Triage Notes (Signed)
Pt in with R knee pain, states he feels he overstretched it after working out 2 days ago. Able to bear weight on affected knee, but has very active job and pain is interfering.

## 2018-07-28 ENCOUNTER — Telehealth: Payer: Commercial Managed Care - PPO | Admitting: Nurse Practitioner

## 2018-07-28 DIAGNOSIS — J069 Acute upper respiratory infection, unspecified: Secondary | ICD-10-CM

## 2018-07-28 MED ORDER — BENZONATATE 100 MG PO CAPS: 100.0000 mg | ORAL_CAPSULE | Freq: Three times a day (TID) | ORAL | 0 refills | Status: AC | PRN

## 2018-07-28 NOTE — Progress Notes (Signed)
E-Visit for Corona Virus Screening  Based on your current symptoms, it seems unlikely that your symptoms are related to the Coronavirus.   Coronavirus disease 2019 (COVID-19) is a respiratory illness that can spread from person to person. The virus that causes COVID-19 is a new virus that was first identified in the country of [place] but is now found in multiple other countries and has spread to the [place].  Symptoms associated with the virus are mild to severe fever, cough, and shortness of breath. There is currently no vaccine to protect against COVID-19, and there is no specific antiviral treatment for the virus.   To be considered HIGH RISK for Coronavirus (COVID-19), you have to meet the following criteria:  . Traveled to [place], [place], [place], [place] or [place]; or in the [place] to [place], [place], [place], or [place]; and have fever, cough, and shortness of breath within the last 2 weeks of travel OR  . Been in close contact with a person diagnosed with COVID-19 within the last 2 weeks and have fever, cough, and shortness of breath  . IF YOU DO NOT MEET THESE CRITERIA, YOU ARE CONSIDERED LOW RISK FOR COVID-19.   It is vitally important that if you feel that you have an infection such as this virus or any other virus that you stay home and away from places where you may spread it to others.  You should self-quarantine for 14 days if you have symptoms that could potentially be coronavirus and avoid contact with people age 65 and older.   You can use medication such as A prescription cough medication called Tessalon Perles 100 mg. You may take 1-2 capsules every 8 hours as needed for cough  You may also take acetaminophen (Tylenol) as needed for fever.   Reduce your risk of any infection by using the same precautions used for avoiding the common cold or flu:  . Wash your hands often with soap and warm water for at least 20 seconds.  If soap and water are not readily  available, use an alcohol-based hand sanitizer with at least 60% alcohol.  . If coughing or sneezing, cover your mouth and nose by coughing or sneezing into the elbow areas of your shirt or coat, into a tissue or into your sleeve (not your hands). . Avoid shaking hands with others and consider head nods or verbal greetings only. . Avoid touching your eyes, nose, or mouth with unwashed hands.  . Avoid close contact with people who are sick. . Avoid places or events with large numbers of people in one location, like concerts or sporting events. . Carefully consider travel plans you have or are making. . If you are planning any travel outside or inside the [place], visit the CDC's Travelers' Health webpage for the latest health notices. . If you have some symptoms but not all symptoms, continue to monitor at home and seek medical attention if your symptoms worsen. . If you are having a medical emergency, call 911.  HOME CARE . Only take medications as instructed by your medical team. . Drink plenty of fluids and get plenty of rest. . A steam or ultrasonic humidifier can help if you have congestion.   GET HELP RIGHT AWAY IF: . You develop worsening fever. . You become short of breath . You cough up blood. . Your symptoms become more severe MAKE SURE YOU   Understand these instructions.  Will watch your condition.  Will get help   right away if you are not doing well or get worse.  Your e-visit answers were reviewed by a board certified advanced clinical practitioner to complete your personal care plan.  Depending on the condition, your plan could have included both over the counter or prescription medications.  If there is a problem please reply once you have received a response from your provider. Your safety is important to us.  If you have drug allergies check your prescription carefully.    You can use MyChart to ask questions about today's visit, request a non-urgent call back, or ask for a  work or school excuse for 24 hours related to this e-Visit. If it has been greater than 24 hours you will need to follow up with your provider, or enter a new e-Visit to address those concerns. You will get an e-mail in the next two days asking about your experience.  I hope that your e-visit has been valuable and will speed your recovery. Thank you for using e-visits.  5-10 minutes spent reviewing and documenting in chart.   

## 2018-07-30 ENCOUNTER — Telehealth: Payer: Commercial Managed Care - PPO | Admitting: Physician Assistant

## 2018-07-30 ENCOUNTER — Encounter: Payer: Self-pay | Admitting: Physician Assistant

## 2018-07-30 DIAGNOSIS — R059 Cough, unspecified: Secondary | ICD-10-CM

## 2018-07-30 DIAGNOSIS — R05 Cough: Secondary | ICD-10-CM

## 2018-07-30 DIAGNOSIS — R509 Fever, unspecified: Secondary | ICD-10-CM

## 2018-07-30 DIAGNOSIS — R0989 Other specified symptoms and signs involving the circulatory and respiratory systems: Secondary | ICD-10-CM

## 2018-07-30 DIAGNOSIS — R0602 Shortness of breath: Secondary | ICD-10-CM

## 2018-07-30 MED ORDER — ALBUTEROL SULFATE HFA 108 (90 BASE) MCG/ACT IN AERS: 2.0000 | INHALATION_SPRAY | RESPIRATORY_TRACT | 0 refills | Status: AC | PRN

## 2018-07-30 NOTE — Progress Notes (Signed)
E-Visit for Corona Virus Screening  Based on your current symptoms, you may very well have the virus, however your symptoms are mild. Currently, not all patients are being tested. If the symptoms are mild and there is not a known exposure, performing the test is not indicated.  Coronavirus disease 2019 (COVID-19) is a respiratory illness that can spread from person to person. The virus that causes COVID-19 is a new virus that was first identified in the country of [place] but is now found in multiple other countries and has spread to the [place].  Symptoms associated with the virus are mild to severe fever, cough, and shortness of breath. There is currently no vaccine to protect against COVID-19, and there is no specific antiviral treatment for the virus.   To be considered HIGH RISK for Coronavirus (COVID-19), you have to meet the following criteria:  . Traveled to [place], [place], [place], [place] or [place]; or in the [place] to [place], [place], [place], or [place]; and have fever, cough, and shortness of breath within the last 2 weeks of travel OR  . Been in close contact with a person diagnosed with COVID-19 within the last 2 weeks and have fever, cough, and shortness of breath  . IF YOU DO NOT MEET THESE CRITERIA, YOU ARE CONSIDERED LOW RISK FOR COVID-19.   It is vitally important that if you feel that you have an infection such as this virus or any other virus that you stay home and away from places where you may spread it to others.  You should self-quarantine for 14 days if you have symptoms that could potentially be coronavirus and avoid contact with people age 52 and older.   You can use medication such as A prescription inhaler called Albuterol MDI 90 mcg /actuation 2 puffs every 4 hours as needed for shortness of breath, wheezing, cough   Also, take the prescribed cough perles given on last evisit.    You may also take acetaminophen (Tylenol) as needed for  fever.  I have also prescribed a work note for the next 14 days.   As per our phone conversation, you stated that you also have "chest congestion" on the right side of the chest,  that you mainly feel first in the morning. You denied any diaphoresis, radiation of your symptoms, nausea, vomiting. If you develop any changes to this chest congestion or if you develop any chest pain, go to the ER.  Reduce your risk of any infection by using the same precautions used for avoiding the common cold or flu:  Marland Kitchen Wash your hands often with soap and warm water for at least 20 seconds.  If soap and water are not readily available, use an alcohol-based hand sanitizer with at least 60% alcohol.  . If coughing or sneezing, cover your mouth and nose by coughing or sneezing into the elbow areas of your shirt or coat, into a tissue or into your sleeve (not your hands). . Avoid shaking hands with others and consider head nods or verbal greetings only. . Avoid touching your eyes, nose, or mouth with unwashed hands.  . Avoid close contact with people who are sick. . Avoid places or events with large numbers of people in one location, like concerts or sporting events. . Carefully consider travel plans you have or are making. . If you are planning any travel outside or inside the [place], visit the CDC's Travelers' Health webpage for the latest health notices. Marland Kitchen  If you have some symptoms but not all symptoms, continue to monitor at home and seek medical attention if your symptoms worsen. . If you are having a medical emergency, call 911.  HOME CARE . Only take medications as instructed by your medical team. . Drink plenty of fluids and get plenty of rest. . A steam or ultrasonic humidifier can help if you have congestion.   GET HELP RIGHT AWAY IF: . You develop worsening fever. . You become short of breath . You cough up blood. . Your symptoms become more severe MAKE SURE YOU   Understand these instructions.  Will  watch your condition.  Will get help right away if you are not doing well or get worse.  Your e-visit answers were reviewed by a board certified advanced clinical practitioner to complete your personal care plan.  Depending on the condition, your plan could have included both over the counter or prescription medications.  If there is a problem please reply once you have received a response from your provider. Your safety is important to us.  If you have drug allergies check your prescription carefully.    You can use MyChart to ask questions about today's visit, request a non-urgent call back, or ask for a work or school excuse for 24 hours related to this e-Visit. If it has been greater than 24 hours you will need to follow up with your provider, or enter a new e-Visit to address those concerns. You will get an e-mail in the next two days asking about your experience.  I hope that your e-visit has been valuable and will speed your recovery. Thank you for using e-visits.   I have spent 7 min in completion and review of this note- Illa LevelSahar  [facility]

## 2018-12-20 ENCOUNTER — Encounter (HOSPITAL_COMMUNITY): Payer: Self-pay | Admitting: Emergency Medicine

## 2018-12-20 ENCOUNTER — Emergency Department (HOSPITAL_COMMUNITY)
Admission: EM | Admit: 2018-12-20 | Discharge: 2018-12-20 | Disposition: A | Discharge status: Home / Self Care | Payer: Commercial Managed Care - PPO | Attending: Emergency Medicine | Admitting: Emergency Medicine

## 2018-12-20 ENCOUNTER — Other Ambulatory Visit: Payer: Self-pay

## 2018-12-20 DIAGNOSIS — F17200 Nicotine dependence, unspecified, uncomplicated: Secondary | ICD-10-CM | POA: Insufficient documentation

## 2018-12-20 DIAGNOSIS — X500XXA Overexertion from strenuous movement or load, initial encounter: Secondary | ICD-10-CM | POA: Insufficient documentation

## 2018-12-20 DIAGNOSIS — Z79899 Other long term (current) drug therapy: Secondary | ICD-10-CM | POA: Insufficient documentation

## 2018-12-20 DIAGNOSIS — T148XXA Other injury of unspecified body region, initial encounter: Secondary | ICD-10-CM

## 2018-12-20 DIAGNOSIS — Y929 Unspecified place or not applicable: Secondary | ICD-10-CM | POA: Insufficient documentation

## 2018-12-20 DIAGNOSIS — Y9389 Activity, other specified: Secondary | ICD-10-CM | POA: Insufficient documentation

## 2018-12-20 DIAGNOSIS — S29012A Strain of muscle and tendon of back wall of thorax, initial encounter: Secondary | ICD-10-CM | POA: Insufficient documentation

## 2018-12-20 DIAGNOSIS — Y999 Unspecified external cause status: Secondary | ICD-10-CM | POA: Insufficient documentation

## 2018-12-20 MED ORDER — CYCLOBENZAPRINE HCL 10 MG PO TABS
10.0000 mg | ORAL_TABLET | Freq: Once | ORAL | Status: AC
  Administered 2018-12-20: 10 mg via ORAL
  Filled 2018-12-20: qty 1

## 2018-12-20 MED ORDER — 1ST MEDX-PATCH/ LIDOCAINE 4-0.025-5-20 % EX PTCH: 1.0000 | MEDICATED_PATCH | Freq: Two times a day (BID) | CUTANEOUS | 0 refills | Status: AC | PRN

## 2018-12-20 MED ORDER — LIDOCAINE 5 % EX PTCH: 1.0000 | MEDICATED_PATCH | CUTANEOUS | 0 refills | Status: AC

## 2018-12-20 MED ORDER — CYCLOBENZAPRINE HCL 5 MG PO TABS: 5.0000 mg | ORAL_TABLET | Freq: Three times a day (TID) | ORAL | 0 refills | Status: AC | PRN

## 2018-12-20 NOTE — Discharge Instructions (Addendum)
David Hayes,   You presented to the ED today with back pain after lifting a heavy object yesterday. You were given a muscle relaxer with relief of symptoms. At home, use heat or ice directly to the area as tolerated. You may use tylenol or ibuprofen for symptomatic treatment of pain. Please see your PCP if your symptoms do not improve within 1-2 weeks.  Please seek emergent medical care if your pain worsens, you have any focal weakness or develop any fevers.

## 2018-12-20 NOTE — ED Provider Notes (Addendum)
[facility] EMERGENCY DEPARTMENT Provider Note   CSN: [id number] Arrival date & time: 12/20/18  4888     History   Chief Complaint Chief Complaint  Patient presents with  . Back Pain    HPI David Hayes is a 33 y.o. male with no significant PMHx presenting to ED with mid back pain after lifting a heavy mirror frame yesterday with his father. Patient reports that since then, he has had stabbing back pain localized to right mid-back and has been unable to get out of bed as a result. He denies any radiation of pain, fevers, chills, chest pain or shortness of breath.   History reviewed. No pertinent past medical history.  There are no active problems to display for this patient.   Past Surgical History:  Procedure Laterality Date  . SHOULDER ARTHROSCOPY W/ LABRAL REPAIR          Home Medications    Prior to Admission medications   Medication Sig Start Date End Date Taking? Authorizing Provider  albuterol (PROVENTIL HFA;VENTOLIN HFA) 108 (90 Base) MCG/ACT inhaler Inhale 1-2 puffs into the lungs every 6 (six) hours as needed for wheezing or shortness of breath. Patient not taking: Reported on 01/31/2018 01/01/18   Couture, Cortni S, PA-C  albuterol (VENTOLIN HFA) 108 (90 Base) MCG/ACT inhaler Inhale 2 puffs into the lungs every 4 (four) hours as needed for wheezing or shortness of breath. 07/30/18   Illa Level M, PA-C  benzonatate (TESSALON PERLES) 100 MG capsule Take 1 capsule (100 mg total) by mouth 3 (three) times daily as needed for cough. 07/28/18   Daphine Deutscher, Mary-Margaret, FNP  fluticasone (FLONASE) 50 MCG/ACT nasal spray Place 2 sprays into both nostrils daily. Patient not taking: Reported on 01/31/2018 01/01/18   Couture, Cortni S, PA-C  lidocaine (XYLOCAINE) 2 % solution Use as directed 15 mLs in the mouth or throat as needed for mouth pain. 02/12/18   Eber Hong, MD  meloxicam (MOBIC) 15 MG tablet Take 1 tablet (15 mg total) by mouth daily. With food.  02/19/18   Harris, Abigail, PA-C  ondansetron (ZOFRAN ODT) 4 MG disintegrating tablet Take 1 tablet (4 mg total) by mouth every 8 (eight) hours as needed for nausea or vomiting. 02/15/18   Robinson, [place] N, PA-C  ondansetron (ZOFRAN) 4 MG tablet Take 1 tablet (4 mg total) by mouth every 6 (six) hours. Patient not taking: Reported on 01/01/2018 11/23/17   Eyvonne Mechanic, PA-C    Family History History reviewed. No pertinent family history.  Social History Social History   Tobacco Use  . Smoking status: Light Tobacco Smoker  . Smokeless tobacco: Never Used  Substance Use Topics  . Alcohol use: Yes    Comment: occasionally  . Drug use: No     Allergies   Patient has no known allergies.   Review of Systems Review of Systems  Constitutional: Negative for chills, diaphoresis, fatigue and fever.  Eyes: Negative.   Respiratory: Negative for chest tightness, shortness of breath and wheezing.   Cardiovascular: Negative for chest pain and palpitations.  Gastrointestinal: Negative for abdominal pain, nausea and vomiting.  Endocrine: Negative.   Genitourinary: Negative.   Musculoskeletal: Positive for back pain. Negative for arthralgias, gait problem, myalgias, neck pain and neck stiffness.  Skin: Negative.   Allergic/Immunologic: Negative.   Neurological: Negative for dizziness, syncope, weakness, light-headedness, numbness and headaches.  Hematological: Negative.   Psychiatric/Behavioral: Negative.   All other systems reviewed and are negative.    Physical Exam  Updated Vital Signs BP 120/77 (BP Location: Left Arm)   Pulse 63   Temp 98 F (36.7 C) (Oral)   Resp 16   Ht 6' (1.829 m)   Wt 77.1 kg   SpO2 100%   BMI 23.06 kg/m   Physical Exam Vitals signs reviewed.  Constitutional:      General: He is in acute distress.     Appearance: Normal appearance. He is normal weight.  HENT:     Head: Normocephalic and atraumatic.     Mouth/Throat:     Mouth: Mucous membranes  are moist.     Pharynx: Oropharynx is clear.  Eyes:     General: No scleral icterus.    Extraocular Movements: Extraocular movements intact.     Conjunctiva/sclera: Conjunctivae normal.     Pupils: Pupils are equal, round, and reactive to light.  Neck:     Musculoskeletal: Normal range of motion and neck supple. No neck rigidity or muscular tenderness.  Cardiovascular:     Rate and Rhythm: Normal rate and regular rhythm.     Pulses: Normal pulses.     Heart sounds: Normal heart sounds. No murmur. No friction rub. No gallop.   Pulmonary:     Effort: Pulmonary effort is normal. No respiratory distress.     Breath sounds: Normal breath sounds. No wheezing or rales.  Abdominal:     General: Bowel sounds are normal. There is no distension.     Palpations: Abdomen is soft.     Tenderness: There is no abdominal tenderness.  Musculoskeletal: Normal range of motion.        General: Tenderness present. No swelling, deformity or signs of injury.     Comments: Tenderness to palpation to immediate right of T10-T11 region  Skin:    General: Skin is warm and dry.     Capillary Refill: Capillary refill takes less than 2 seconds.     Findings: No bruising or lesion.  Neurological:     General: No focal deficit present.     Mental Status: He is alert and oriented to person, place, and time. Mental status is at baseline.     Cranial Nerves: No cranial nerve deficit.     Sensory: No sensory deficit.     Motor: No weakness.     Coordination: Coordination normal.     Gait: Gait normal.      ED Treatments / Results  Labs (all labs ordered are listed, but only abnormal results are displayed) Labs Reviewed - No data to display  EKG None  Radiology No results found.  Procedures Procedures (including critical care time)  Medications Ordered in ED Medications - No data to display   Initial Impression / Assessment and Plan / ED Course  I have reviewed the triage vital signs and the  nursing notes.  Pertinent labs & imaging results that were available during my care of the patient were reviewed by me and considered in my medical decision making (see chart for details).  Patient is a 33yo male with no significant PMHx presenting with right sided mid back pain after lifting a heavy mirror frame yesterday. Pain is localized to the mid back on the right side and feels like a stabbing pain without radiation to the lumbar, thoracic or cervical spine. On examination, patient appears to be hemodynamically stable and in no acute distress. He has localized tenderness to palpation to the right of T10-T11 region. As pain is localized and directly after trauma, exacerbated by  movement and position changes, it is likely to be musculoskeletal in nature. Patient given flexeril in the ED with relief of symptoms.  Discharged to home with flexeril and lidocaine patch and instructions for symptomatic treatment. Return precautions provided.   Final Clinical Impressions(s) / ED Diagnoses   Final diagnoses:  None    ED Discharge Orders    None       Eliezer BottomAslam, Ajia Chadderdon, MD 12/20/18 1000    Eliezer BottomAslam, Teigen Bellin, MD 12/20/18 1004    Alvira MondaySchlossman, Erin, MD 12/20/18 2146

## 2018-12-20 NOTE — ED Notes (Signed)
Pt discharged to home. Education provided with teachback. Questions asked and answered.  AVS given

## 2018-12-20 NOTE — ED Triage Notes (Signed)
Pt states he was helping move a heavy object yesterday. Pt states this morning his mid to lower back is very tight and painful. Pt is ambulatory.
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
## 2022-02-07 ENCOUNTER — Encounter (HOSPITAL_COMMUNITY): Payer: Self-pay | Admitting: Emergency Medicine

## 2022-02-07 ENCOUNTER — Emergency Department (HOSPITAL_COMMUNITY)
Admission: EM | Admit: 2022-02-07 | Discharge: 2022-02-07 | Disposition: A | Discharge status: Home / Self Care | Payer: Commercial Managed Care - PPO | Attending: Emergency Medicine | Admitting: Emergency Medicine

## 2022-02-07 ENCOUNTER — Other Ambulatory Visit: Payer: Self-pay

## 2022-02-07 DIAGNOSIS — R202 Paresthesia of skin: Secondary | ICD-10-CM | POA: Diagnosis not present

## 2022-02-07 DIAGNOSIS — M25532 Pain in left wrist: Secondary | ICD-10-CM | POA: Insufficient documentation

## 2022-02-07 MED ORDER — IBUPROFEN 800 MG PO TABS
800.0000 mg | ORAL_TABLET | Freq: Once | ORAL | Status: AC
  Administered 2022-02-07: 800 mg via ORAL
  Filled 2022-02-07: qty 1

## 2022-02-07 NOTE — ED Triage Notes (Signed)
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

## 2022-02-07 NOTE — ED Provider Triage Note (Signed)
Emergency Medicine Provider Triage Evaluation Note  David Hayes is a 36 y.o. male, who presents to the ED secondary to left hand, irritation for the last couple days.  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  States that the pain is worse with moving his hand.  Is tender along the wrist on both sides but particularly along the thumb.  Denies any changes in color, coolness to arm.  Denies any trauma.   Review of Systems  Positive: L wrist pain Negative: Color change  Physical Exam  BP 110/76 (BP Location: Right Arm)   Pulse 72   Temp 97.7 F (36.5 C) (Oral)   Resp 16   SpO2 99%  Gen:   Awake, no distress   Resp:  Normal effort  MSK:   Moves extremities without difficulty  Other:  TTP of radial aspect of left wrist  Medical Decision Making  Medically screening exam initiated at 2:30 PM.  Appropriate orders placed.  David Hayes was informed that the remainder of the evaluation will be completed by another provider, this initial triage assessment does not replace that evaluation, and the importance of remaining in the ED until their evaluation is complete.     Osvaldo Shipper, [place] 02/07/22 1447

## 2022-02-07 NOTE — Discharge Instructions (Addendum)
Please follow-up with your primary care doctor and the orthopedic doctor.  Use wrist brace for increased ability, please rest your hand, and do not engage in repetitive activities.  You can use ice as well and ibuprofen Tylenol for pain control.  If you develop coolness, changes in color of your skin, or severe pain in the wrist please return to the ER.

## 2022-02-07 NOTE — ED Provider Notes (Signed)
[facility] Provider Note   CSN: [id number] Arrival date & time: 02/07/22  1349     History  Chief Complaint  Patient presents with   Tingling   Wrist Pain    David Hayes is a 36 y.o. male, who presents to the ED secondary to left hand, irritation for the last couple days.  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  States that the pain is worse with moving his hand.  Is tender along the wrist on both sides but particularly along the thumb.  Denies any changes in color, coolness to arm.  Denies any trauma.     Home Medications Prior to Admission medications   Medication Sig Start Date End Date Taking? Authorizing Provider  albuterol (PROVENTIL HFA;VENTOLIN HFA) 108 (90 Base) MCG/ACT inhaler Inhale 1-2 puffs into the lungs every 6 (six) hours as needed for wheezing or shortness of breath. Patient not taking: Reported on 01/31/2018 01/01/18   Couture, Cortni S, PA-C  albuterol (VENTOLIN HFA) 108 (90 Base) MCG/ACT inhaler Inhale 2 puffs into the lungs every 4 (four) hours as needed for wheezing or shortness of breath. 07/30/18   Lacy Duverney M, PA-C  benzonatate (TESSALON PERLES) 100 MG capsule Take 1 capsule (100 mg total) by mouth 3 (three) times daily as needed for cough. 07/28/18   Hassell Done Mary-Margaret, FNP  cyclobenzaprine (FLEXERIL) 5 MG tablet Take 1 tablet (5 mg total) by mouth 3 (three) times daily as needed for muscle spasms. 12/20/18   Harvie Heck, MD  fluticasone (FLONASE) 50 MCG/ACT nasal spray Place 2 sprays into both nostrils daily. Patient not taking: Reported on 01/31/2018 01/01/18   Couture, Cortni S, PA-C  lidocaine (LIDODERM) 5 % Place 1 patch onto the skin daily. Remove & Discard patch within 12 hours or as directed by MD 12/20/18   Gareth Morgan, MD  lidocaine (XYLOCAINE) 2 % solution Use as directed 15 mLs in the mouth or throat as needed for mouth pain.  02/12/18   Noemi Chapel, MD  meloxicam (MOBIC) 15 MG tablet Take 1 tablet (15 mg total) by mouth daily. With food. 02/19/18   Harris, Abigail, PA-C  ondansetron (ZOFRAN ODT) 4 MG disintegrating tablet Take 1 tablet (4 mg total) by mouth every 8 (eight) hours as needed for nausea or vomiting. 02/15/18   Robinson, [place] N, PA-C  ondansetron (ZOFRAN) 4 MG tablet Take 1 tablet (4 mg total) by mouth every 6 (six) hours. Patient not taking: Reported on 01/01/2018 11/23/17   Okey Regal, PA-C      Allergies    Patient has no known allergies.    Review of Systems   Review of Systems  Musculoskeletal:        +L wrist pain  Skin:  Negative for color change and pallor.    Physical Exam Updated Vital Signs BP 110/76 (BP Location: Right Arm)   Pulse 72   Temp 97.7 F (36.5 C) (Oral)   Resp 16   SpO2 99%  Physical Exam Vitals and nursing note reviewed.  Constitutional:      General: He is not in acute distress.    Appearance: He is well-developed.  HENT:     Head: Normocephalic and atraumatic.  Eyes:     Conjunctiva/sclera: Conjunctivae normal.  Cardiovascular:     Rate and Rhythm: Normal rate and regular rhythm.     Heart sounds: No murmur heard. Pulmonary:  Effort: Pulmonary effort is normal. No respiratory distress.     Breath sounds: Normal breath sounds.  Abdominal:     Palpations: Abdomen is soft.     Tenderness: There is no abdominal tenderness.  Musculoskeletal:        General: No swelling.     Cervical back: Neck supple.     Comments: Left hand: TTP of radial aspect of wrist extending into thumb. Radial pulses present. Reduced grip strength. Able to flex, extend, ulnar and radial deviate wrist.  No pain w/supination/pronation of arm. Pain w/wrist w/radial deviation. +Phalen's test. Two point discrimination intact. Normal thumb opposition. Intact ROM for all MCPs, PIPs, and DIPs.  No snuffbox ttp. No sensory deficits. Capillary refill <2sec   Skin:    General: Skin  is warm and dry.     Capillary Refill: Capillary refill takes less than 2 seconds.  Neurological:     Mental Status: He is alert.  Psychiatric:        Mood and Affect: Mood normal.     ED Results / Procedures / Treatments   Labs (all labs ordered are listed, but only abnormal results are displayed) Labs Reviewed - No data to display  EKG None  Radiology No results found.  Procedures Procedures   Medications Ordered in ED Medications  ibuprofen (ADVIL) tablet 800 mg (has no administration in time range)    ED Course/ Medical Decision Making/ A&P                           Medical Decision Making Patient here for left wrist pain pain for the last couple days, states he has very repetitive job, and uses hands a lot.  Denies any swelling of the area or redness.  Has positive Phalen's test, tenderness to palpation radial aspect of wrist.  No snuffbox tenderness.  Concern for possible radiculopathy versus tendinopathy.  Discussed wrist brace brace, offered x-ray, patient declined.  No red flag symptoms, including snuffbox tenderness.  Discussed importance of follow-up with orthopedics, and rest.  Provided with a work note.  Return precautions emphasized.   Final Clinical Impression(s) / ED Diagnoses Final diagnoses:  None    Rx / DC Orders ED Discharge Orders     None         Diamantina Monks, Si Gaul, PA 02/07/22 1446    Carmin Muskrat, MD 02/07/22 1614
